# Patient Record
Sex: Female | Born: 1935 | Race: White | Hispanic: No | Smoking: Never smoker
Health system: Southern US, Community
[De-identification: ages and names within clinical notes are randomized; demographics above are authoritative.]

## PROBLEM LIST (undated history)

## (undated) DIAGNOSIS — K219 Gastro-esophageal reflux disease without esophagitis: Secondary | ICD-10-CM

## (undated) DIAGNOSIS — F329 Major depressive disorder, single episode, unspecified: Secondary | ICD-10-CM

## (undated) DIAGNOSIS — I38 Endocarditis, valve unspecified: Secondary | ICD-10-CM

## (undated) DIAGNOSIS — I10 Essential (primary) hypertension: Secondary | ICD-10-CM

## (undated) DIAGNOSIS — F419 Anxiety disorder, unspecified: Secondary | ICD-10-CM

## (undated) DIAGNOSIS — F32A Depression, unspecified: Secondary | ICD-10-CM

## (undated) DIAGNOSIS — F0390 Unspecified dementia without behavioral disturbance: Secondary | ICD-10-CM

## (undated) DIAGNOSIS — I428 Other cardiomyopathies: Secondary | ICD-10-CM

## (undated) DIAGNOSIS — I509 Heart failure, unspecified: Secondary | ICD-10-CM

## (undated) DIAGNOSIS — E039 Hypothyroidism, unspecified: Secondary | ICD-10-CM

## (undated) DIAGNOSIS — D219 Benign neoplasm of connective and other soft tissue, unspecified: Secondary | ICD-10-CM

## (undated) DIAGNOSIS — I4891 Unspecified atrial fibrillation: Secondary | ICD-10-CM

## (undated) DIAGNOSIS — C9110 Chronic lymphocytic leukemia of B-cell type not having achieved remission: Secondary | ICD-10-CM

## (undated) DIAGNOSIS — R630 Anorexia: Secondary | ICD-10-CM

## (undated) DIAGNOSIS — N2889 Other specified disorders of kidney and ureter: Secondary | ICD-10-CM

## (undated) DIAGNOSIS — R413 Other amnesia: Secondary | ICD-10-CM

## (undated) DIAGNOSIS — I872 Venous insufficiency (chronic) (peripheral): Secondary | ICD-10-CM

## (undated) DIAGNOSIS — I8390 Asymptomatic varicose veins of unspecified lower extremity: Secondary | ICD-10-CM

## (undated) DIAGNOSIS — E785 Hyperlipidemia, unspecified: Secondary | ICD-10-CM

## (undated) HISTORY — PX: OTHER SURGICAL HISTORY: SHX169

## (undated) HISTORY — DX: Venous insufficiency (chronic) (peripheral): I87.2

## (undated) HISTORY — DX: Unspecified atrial fibrillation: I48.91

## (undated) HISTORY — DX: Other specified disorders of kidney and ureter: N28.89

## (undated) HISTORY — DX: Anorexia: R63.0

## (undated) HISTORY — DX: Benign neoplasm of connective and other soft tissue, unspecified: D21.9

## (undated) HISTORY — PX: CHOLECYSTECTOMY: SHX55

## (undated) HISTORY — DX: Asymptomatic varicose veins of unspecified lower extremity: I83.90

## (undated) HISTORY — DX: Chronic lymphocytic leukemia of B-cell type not having achieved remission: C91.10

## (undated) HISTORY — DX: Unspecified dementia, unspecified severity, without behavioral disturbance, psychotic disturbance, mood disturbance, and anxiety: F03.90

## (undated) HISTORY — DX: Anxiety disorder, unspecified: F41.9

## (undated) HISTORY — DX: Depression, unspecified: F32.A

## (undated) HISTORY — DX: Hyperlipidemia, unspecified: E78.5

## (undated) HISTORY — DX: Endocarditis, valve unspecified: I38

## (undated) HISTORY — DX: Hypothyroidism, unspecified: E03.9

## (undated) HISTORY — PX: ABDOMINAL HYSTERECTOMY: SHX81

## (undated) HISTORY — DX: Major depressive disorder, single episode, unspecified: F32.9

## (undated) HISTORY — DX: Other cardiomyopathies: I42.8

## (undated) HISTORY — PX: PACEMAKER IMPLANT: EP1218

## (undated) HISTORY — PX: CARDIAC ELECTROPHYSIOLOGY STUDY AND ABLATION: SHX1294

## (undated) HISTORY — DX: Heart failure, unspecified: I50.9

## (undated) HISTORY — DX: Essential (primary) hypertension: I10

## (undated) HISTORY — DX: Gastro-esophageal reflux disease without esophagitis: K21.9

## (undated) HISTORY — DX: Other amnesia: R41.3

---
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
## 2012-03-03 DIAGNOSIS — E039 Hypothyroidism, unspecified: Secondary | ICD-10-CM | POA: Diagnosis not present

## 2012-03-03 DIAGNOSIS — E7800 Pure hypercholesterolemia, unspecified: Secondary | ICD-10-CM | POA: Diagnosis not present

## 2012-03-03 DIAGNOSIS — I10 Essential (primary) hypertension: Secondary | ICD-10-CM | POA: Diagnosis not present

## 2012-03-03 DIAGNOSIS — Z79899 Other long term (current) drug therapy: Secondary | ICD-10-CM | POA: Diagnosis not present

## 2012-03-06 DIAGNOSIS — R7309 Other abnormal glucose: Secondary | ICD-10-CM | POA: Diagnosis not present

## 2012-03-19 DIAGNOSIS — I10 Essential (primary) hypertension: Secondary | ICD-10-CM | POA: Diagnosis not present

## 2012-03-19 DIAGNOSIS — Z950 Presence of cardiac pacemaker: Secondary | ICD-10-CM | POA: Diagnosis not present

## 2012-03-19 DIAGNOSIS — E039 Hypothyroidism, unspecified: Secondary | ICD-10-CM | POA: Diagnosis not present

## 2012-03-19 DIAGNOSIS — I4891 Unspecified atrial fibrillation: Secondary | ICD-10-CM | POA: Diagnosis not present

## 2012-04-28 DIAGNOSIS — I498 Other specified cardiac arrhythmias: Secondary | ICD-10-CM | POA: Diagnosis not present

## 2012-05-07 DIAGNOSIS — H524 Presbyopia: Secondary | ICD-10-CM | POA: Diagnosis not present

## 2012-05-07 DIAGNOSIS — H35019 Changes in retinal vascular appearance, unspecified eye: Secondary | ICD-10-CM | POA: Diagnosis not present

## 2012-07-30 DIAGNOSIS — I498 Other specified cardiac arrhythmias: Secondary | ICD-10-CM | POA: Diagnosis not present

## 2012-10-05 DIAGNOSIS — I4891 Unspecified atrial fibrillation: Secondary | ICD-10-CM | POA: Diagnosis not present

## 2012-10-05 DIAGNOSIS — I10 Essential (primary) hypertension: Secondary | ICD-10-CM | POA: Diagnosis not present

## 2012-10-05 DIAGNOSIS — Z950 Presence of cardiac pacemaker: Secondary | ICD-10-CM | POA: Diagnosis not present

## 2012-10-07 DIAGNOSIS — Z23 Encounter for immunization: Secondary | ICD-10-CM | POA: Diagnosis not present

## 2012-11-03 DIAGNOSIS — I499 Cardiac arrhythmia, unspecified: Secondary | ICD-10-CM | POA: Diagnosis not present

## 2013-02-04 DIAGNOSIS — Z950 Presence of cardiac pacemaker: Secondary | ICD-10-CM | POA: Diagnosis not present

## 2013-02-04 DIAGNOSIS — I4891 Unspecified atrial fibrillation: Secondary | ICD-10-CM | POA: Diagnosis not present

## 2013-02-04 DIAGNOSIS — E039 Hypothyroidism, unspecified: Secondary | ICD-10-CM | POA: Diagnosis not present

## 2013-02-12 DIAGNOSIS — E039 Hypothyroidism, unspecified: Secondary | ICD-10-CM | POA: Diagnosis not present

## 2013-02-12 DIAGNOSIS — Z0000 Encounter for general adult medical examination without abnormal findings: Secondary | ICD-10-CM | POA: Diagnosis not present

## 2013-02-12 DIAGNOSIS — I10 Essential (primary) hypertension: Secondary | ICD-10-CM | POA: Diagnosis not present

## 2013-02-12 DIAGNOSIS — Z79899 Other long term (current) drug therapy: Secondary | ICD-10-CM | POA: Diagnosis not present

## 2013-03-31 DIAGNOSIS — Z950 Presence of cardiac pacemaker: Secondary | ICD-10-CM | POA: Diagnosis not present

## 2013-03-31 DIAGNOSIS — I4891 Unspecified atrial fibrillation: Secondary | ICD-10-CM | POA: Diagnosis not present

## 2013-03-31 DIAGNOSIS — I10 Essential (primary) hypertension: Secondary | ICD-10-CM | POA: Diagnosis not present

## 2013-04-06 DIAGNOSIS — I10 Essential (primary) hypertension: Secondary | ICD-10-CM | POA: Diagnosis not present

## 2013-04-06 DIAGNOSIS — Z79899 Other long term (current) drug therapy: Secondary | ICD-10-CM | POA: Diagnosis not present

## 2013-04-08 DIAGNOSIS — I10 Essential (primary) hypertension: Secondary | ICD-10-CM | POA: Diagnosis not present

## 2013-05-05 DIAGNOSIS — Z950 Presence of cardiac pacemaker: Secondary | ICD-10-CM | POA: Diagnosis not present

## 2013-05-05 DIAGNOSIS — I4891 Unspecified atrial fibrillation: Secondary | ICD-10-CM | POA: Diagnosis not present

## 2013-06-03 DIAGNOSIS — I4891 Unspecified atrial fibrillation: Secondary | ICD-10-CM | POA: Diagnosis not present

## 2013-08-10 DIAGNOSIS — M549 Dorsalgia, unspecified: Secondary | ICD-10-CM | POA: Diagnosis not present

## 2013-08-10 DIAGNOSIS — R52 Pain, unspecified: Secondary | ICD-10-CM | POA: Diagnosis not present

## 2013-08-10 DIAGNOSIS — I498 Other specified cardiac arrhythmias: Secondary | ICD-10-CM | POA: Diagnosis not present

## 2013-08-16 DIAGNOSIS — M545 Low back pain: Secondary | ICD-10-CM | POA: Diagnosis not present

## 2013-08-18 DIAGNOSIS — M545 Low back pain: Secondary | ICD-10-CM | POA: Diagnosis not present

## 2013-08-20 DIAGNOSIS — M545 Low back pain: Secondary | ICD-10-CM | POA: Diagnosis not present

## 2013-08-23 DIAGNOSIS — M545 Low back pain: Secondary | ICD-10-CM | POA: Diagnosis not present

## 2013-08-25 DIAGNOSIS — M545 Low back pain: Secondary | ICD-10-CM | POA: Diagnosis not present

## 2013-08-27 DIAGNOSIS — M545 Low back pain: Secondary | ICD-10-CM | POA: Diagnosis not present

## 2013-09-02 DIAGNOSIS — R5381 Other malaise: Secondary | ICD-10-CM | POA: Diagnosis not present

## 2013-09-02 DIAGNOSIS — Z79899 Other long term (current) drug therapy: Secondary | ICD-10-CM | POA: Diagnosis not present

## 2013-09-02 DIAGNOSIS — Z23 Encounter for immunization: Secondary | ICD-10-CM | POA: Diagnosis not present

## 2013-09-06 DIAGNOSIS — R7309 Other abnormal glucose: Secondary | ICD-10-CM | POA: Diagnosis not present

## 2013-09-09 DIAGNOSIS — I4891 Unspecified atrial fibrillation: Secondary | ICD-10-CM | POA: Diagnosis not present

## 2013-09-09 DIAGNOSIS — E039 Hypothyroidism, unspecified: Secondary | ICD-10-CM | POA: Diagnosis not present

## 2013-09-09 DIAGNOSIS — Z950 Presence of cardiac pacemaker: Secondary | ICD-10-CM | POA: Diagnosis not present

## 2013-09-09 DIAGNOSIS — R5381 Other malaise: Secondary | ICD-10-CM | POA: Diagnosis not present

## 2013-09-09 DIAGNOSIS — I495 Sick sinus syndrome: Secondary | ICD-10-CM | POA: Diagnosis not present

## 2013-09-09 DIAGNOSIS — I10 Essential (primary) hypertension: Secondary | ICD-10-CM | POA: Diagnosis not present

## 2013-09-13 DIAGNOSIS — Z79899 Other long term (current) drug therapy: Secondary | ICD-10-CM | POA: Diagnosis not present

## 2013-09-13 DIAGNOSIS — I4891 Unspecified atrial fibrillation: Secondary | ICD-10-CM | POA: Diagnosis not present

## 2013-09-13 DIAGNOSIS — I10 Essential (primary) hypertension: Secondary | ICD-10-CM | POA: Diagnosis not present

## 2013-09-13 DIAGNOSIS — D649 Anemia, unspecified: Secondary | ICD-10-CM | POA: Diagnosis not present

## 2013-10-01 DIAGNOSIS — M5137 Other intervertebral disc degeneration, lumbosacral region: Secondary | ICD-10-CM | POA: Diagnosis not present

## 2013-10-01 DIAGNOSIS — M545 Low back pain: Secondary | ICD-10-CM | POA: Diagnosis not present

## 2013-10-01 DIAGNOSIS — M4120 Other idiopathic scoliosis, site unspecified: Secondary | ICD-10-CM | POA: Diagnosis not present

## 2013-10-05 DIAGNOSIS — M545 Low back pain: Secondary | ICD-10-CM | POA: Diagnosis not present

## 2013-10-08 DIAGNOSIS — M545 Low back pain: Secondary | ICD-10-CM | POA: Diagnosis not present

## 2013-10-11 DIAGNOSIS — M545 Low back pain: Secondary | ICD-10-CM | POA: Diagnosis not present

## 2013-10-13 DIAGNOSIS — M545 Low back pain: Secondary | ICD-10-CM | POA: Diagnosis not present

## 2013-10-14 DIAGNOSIS — I495 Sick sinus syndrome: Secondary | ICD-10-CM | POA: Diagnosis not present

## 2013-10-14 DIAGNOSIS — I10 Essential (primary) hypertension: Secondary | ICD-10-CM | POA: Diagnosis not present

## 2013-10-14 DIAGNOSIS — R5381 Other malaise: Secondary | ICD-10-CM | POA: Diagnosis not present

## 2013-10-14 DIAGNOSIS — I4891 Unspecified atrial fibrillation: Secondary | ICD-10-CM | POA: Diagnosis not present

## 2013-10-14 DIAGNOSIS — E039 Hypothyroidism, unspecified: Secondary | ICD-10-CM | POA: Diagnosis not present

## 2013-10-14 DIAGNOSIS — Z950 Presence of cardiac pacemaker: Secondary | ICD-10-CM | POA: Diagnosis not present

## 2013-10-25 DIAGNOSIS — I4891 Unspecified atrial fibrillation: Secondary | ICD-10-CM | POA: Diagnosis not present

## 2013-11-11 DIAGNOSIS — I498 Other specified cardiac arrhythmias: Secondary | ICD-10-CM | POA: Diagnosis not present

## 2013-11-15 DIAGNOSIS — I10 Essential (primary) hypertension: Secondary | ICD-10-CM | POA: Diagnosis not present

## 2013-11-15 DIAGNOSIS — Z950 Presence of cardiac pacemaker: Secondary | ICD-10-CM | POA: Diagnosis not present

## 2013-11-15 DIAGNOSIS — R5381 Other malaise: Secondary | ICD-10-CM | POA: Diagnosis not present

## 2013-11-15 DIAGNOSIS — E039 Hypothyroidism, unspecified: Secondary | ICD-10-CM | POA: Diagnosis not present

## 2013-11-15 DIAGNOSIS — I495 Sick sinus syndrome: Secondary | ICD-10-CM | POA: Diagnosis not present

## 2013-11-15 DIAGNOSIS — I4891 Unspecified atrial fibrillation: Secondary | ICD-10-CM | POA: Diagnosis not present

## 2013-11-26 DIAGNOSIS — M549 Dorsalgia, unspecified: Secondary | ICD-10-CM | POA: Diagnosis not present

## 2013-12-01 DIAGNOSIS — E039 Hypothyroidism, unspecified: Secondary | ICD-10-CM | POA: Diagnosis not present

## 2013-12-01 DIAGNOSIS — I4891 Unspecified atrial fibrillation: Secondary | ICD-10-CM | POA: Diagnosis not present

## 2013-12-01 DIAGNOSIS — Z79899 Other long term (current) drug therapy: Secondary | ICD-10-CM | POA: Diagnosis not present

## 2013-12-01 DIAGNOSIS — D649 Anemia, unspecified: Secondary | ICD-10-CM | POA: Diagnosis not present

## 2013-12-01 DIAGNOSIS — I10 Essential (primary) hypertension: Secondary | ICD-10-CM | POA: Diagnosis not present

## 2013-12-03 ENCOUNTER — Other Ambulatory Visit: Payer: Self-pay | Admitting: Orthopaedic Surgery

## 2013-12-03 DIAGNOSIS — M545 Low back pain, unspecified: Secondary | ICD-10-CM

## 2013-12-06 ENCOUNTER — Ambulatory Visit
Admission: RE | Admit: 2013-12-06 | Discharge: 2013-12-06 | Disposition: A | Discharge status: Home / Self Care | Payer: Medicare Other | Source: Ambulatory Visit | Attending: Orthopaedic Surgery | Admitting: Orthopaedic Surgery

## 2013-12-06 DIAGNOSIS — M48061 Spinal stenosis, lumbar region without neurogenic claudication: Secondary | ICD-10-CM | POA: Diagnosis not present

## 2013-12-06 DIAGNOSIS — M545 Low back pain, unspecified: Secondary | ICD-10-CM

## 2013-12-06 DIAGNOSIS — M4120 Other idiopathic scoliosis, site unspecified: Secondary | ICD-10-CM | POA: Diagnosis not present

## 2013-12-06 DIAGNOSIS — M5137 Other intervertebral disc degeneration, lumbosacral region: Secondary | ICD-10-CM | POA: Diagnosis not present

## 2013-12-09 DIAGNOSIS — M545 Low back pain, unspecified: Secondary | ICD-10-CM | POA: Diagnosis not present

## 2013-12-09 DIAGNOSIS — M4120 Other idiopathic scoliosis, site unspecified: Secondary | ICD-10-CM | POA: Diagnosis not present

## 2013-12-09 DIAGNOSIS — M5137 Other intervertebral disc degeneration, lumbosacral region: Secondary | ICD-10-CM | POA: Diagnosis not present

## 2013-12-14 DIAGNOSIS — M4120 Other idiopathic scoliosis, site unspecified: Secondary | ICD-10-CM | POA: Diagnosis not present

## 2013-12-17 DIAGNOSIS — R112 Nausea with vomiting, unspecified: Secondary | ICD-10-CM | POA: Diagnosis not present

## 2013-12-22 DIAGNOSIS — M47817 Spondylosis without myelopathy or radiculopathy, lumbosacral region: Secondary | ICD-10-CM | POA: Diagnosis not present

## 2013-12-22 DIAGNOSIS — M545 Low back pain, unspecified: Secondary | ICD-10-CM | POA: Diagnosis not present

## 2013-12-22 DIAGNOSIS — M4120 Other idiopathic scoliosis, site unspecified: Secondary | ICD-10-CM | POA: Diagnosis not present

## 2013-12-29 DIAGNOSIS — I4891 Unspecified atrial fibrillation: Secondary | ICD-10-CM | POA: Diagnosis not present

## 2014-01-04 DIAGNOSIS — M545 Low back pain, unspecified: Secondary | ICD-10-CM | POA: Diagnosis not present

## 2014-01-04 DIAGNOSIS — M4120 Other idiopathic scoliosis, site unspecified: Secondary | ICD-10-CM | POA: Diagnosis not present

## 2014-01-04 DIAGNOSIS — M47817 Spondylosis without myelopathy or radiculopathy, lumbosacral region: Secondary | ICD-10-CM | POA: Diagnosis not present

## 2014-01-11 DIAGNOSIS — M545 Low back pain, unspecified: Secondary | ICD-10-CM | POA: Diagnosis not present

## 2014-01-11 DIAGNOSIS — M4120 Other idiopathic scoliosis, site unspecified: Secondary | ICD-10-CM | POA: Diagnosis not present

## 2014-01-11 DIAGNOSIS — M47817 Spondylosis without myelopathy or radiculopathy, lumbosacral region: Secondary | ICD-10-CM | POA: Diagnosis not present

## 2014-01-27 DIAGNOSIS — R5381 Other malaise: Secondary | ICD-10-CM | POA: Diagnosis not present

## 2014-01-27 DIAGNOSIS — Z950 Presence of cardiac pacemaker: Secondary | ICD-10-CM | POA: Diagnosis not present

## 2014-01-27 DIAGNOSIS — I495 Sick sinus syndrome: Secondary | ICD-10-CM | POA: Diagnosis not present

## 2014-01-27 DIAGNOSIS — I4891 Unspecified atrial fibrillation: Secondary | ICD-10-CM | POA: Diagnosis not present

## 2014-01-27 DIAGNOSIS — R5383 Other fatigue: Secondary | ICD-10-CM | POA: Diagnosis not present

## 2014-01-27 DIAGNOSIS — I10 Essential (primary) hypertension: Secondary | ICD-10-CM | POA: Diagnosis not present

## 2014-02-01 DIAGNOSIS — I4891 Unspecified atrial fibrillation: Secondary | ICD-10-CM | POA: Diagnosis not present

## 2014-02-01 DIAGNOSIS — Z950 Presence of cardiac pacemaker: Secondary | ICD-10-CM | POA: Diagnosis not present

## 2014-02-23 DIAGNOSIS — M48061 Spinal stenosis, lumbar region without neurogenic claudication: Secondary | ICD-10-CM | POA: Diagnosis not present

## 2014-02-23 DIAGNOSIS — M47817 Spondylosis without myelopathy or radiculopathy, lumbosacral region: Secondary | ICD-10-CM | POA: Diagnosis not present

## 2014-02-23 DIAGNOSIS — M5380 Other specified dorsopathies, site unspecified: Secondary | ICD-10-CM | POA: Diagnosis not present

## 2014-03-07 DIAGNOSIS — F329 Major depressive disorder, single episode, unspecified: Secondary | ICD-10-CM | POA: Diagnosis not present

## 2014-03-07 DIAGNOSIS — I4891 Unspecified atrial fibrillation: Secondary | ICD-10-CM | POA: Diagnosis not present

## 2014-03-07 DIAGNOSIS — G8929 Other chronic pain: Secondary | ICD-10-CM | POA: Diagnosis not present

## 2014-03-07 DIAGNOSIS — M549 Dorsalgia, unspecified: Secondary | ICD-10-CM | POA: Diagnosis not present

## 2014-03-28 DIAGNOSIS — Z0000 Encounter for general adult medical examination without abnormal findings: Secondary | ICD-10-CM | POA: Diagnosis not present

## 2014-03-28 DIAGNOSIS — F329 Major depressive disorder, single episode, unspecified: Secondary | ICD-10-CM | POA: Diagnosis not present

## 2014-03-30 DIAGNOSIS — M48061 Spinal stenosis, lumbar region without neurogenic claudication: Secondary | ICD-10-CM | POA: Diagnosis not present

## 2014-03-30 DIAGNOSIS — M545 Low back pain, unspecified: Secondary | ICD-10-CM | POA: Diagnosis not present

## 2014-03-30 DIAGNOSIS — M47817 Spondylosis without myelopathy or radiculopathy, lumbosacral region: Secondary | ICD-10-CM | POA: Diagnosis not present

## 2014-03-30 DIAGNOSIS — M5126 Other intervertebral disc displacement, lumbar region: Secondary | ICD-10-CM | POA: Diagnosis not present

## 2014-04-04 DIAGNOSIS — M5126 Other intervertebral disc displacement, lumbar region: Secondary | ICD-10-CM | POA: Diagnosis not present

## 2014-04-04 DIAGNOSIS — M545 Low back pain, unspecified: Secondary | ICD-10-CM | POA: Diagnosis not present

## 2014-04-04 DIAGNOSIS — M48061 Spinal stenosis, lumbar region without neurogenic claudication: Secondary | ICD-10-CM | POA: Diagnosis not present

## 2014-04-14 DIAGNOSIS — I959 Hypotension, unspecified: Secondary | ICD-10-CM | POA: Diagnosis not present

## 2014-04-14 DIAGNOSIS — E039 Hypothyroidism, unspecified: Secondary | ICD-10-CM | POA: Diagnosis not present

## 2014-04-14 DIAGNOSIS — R5381 Other malaise: Secondary | ICD-10-CM | POA: Diagnosis not present

## 2014-04-14 DIAGNOSIS — R634 Abnormal weight loss: Secondary | ICD-10-CM | POA: Diagnosis not present

## 2014-04-14 DIAGNOSIS — R5383 Other fatigue: Secondary | ICD-10-CM | POA: Diagnosis not present

## 2014-04-15 DIAGNOSIS — D649 Anemia, unspecified: Secondary | ICD-10-CM | POA: Diagnosis not present

## 2014-04-19 DIAGNOSIS — R5383 Other fatigue: Secondary | ICD-10-CM | POA: Diagnosis not present

## 2014-04-19 DIAGNOSIS — Z950 Presence of cardiac pacemaker: Secondary | ICD-10-CM | POA: Diagnosis not present

## 2014-04-19 DIAGNOSIS — I10 Essential (primary) hypertension: Secondary | ICD-10-CM | POA: Diagnosis not present

## 2014-04-19 DIAGNOSIS — I495 Sick sinus syndrome: Secondary | ICD-10-CM | POA: Diagnosis not present

## 2014-04-19 DIAGNOSIS — I4891 Unspecified atrial fibrillation: Secondary | ICD-10-CM | POA: Diagnosis not present

## 2014-04-19 DIAGNOSIS — R5381 Other malaise: Secondary | ICD-10-CM | POA: Diagnosis not present

## 2014-04-19 DIAGNOSIS — E039 Hypothyroidism, unspecified: Secondary | ICD-10-CM | POA: Diagnosis not present

## 2014-04-19 DIAGNOSIS — D649 Anemia, unspecified: Secondary | ICD-10-CM | POA: Diagnosis not present

## 2014-04-20 DIAGNOSIS — D3000 Benign neoplasm of unspecified kidney: Secondary | ICD-10-CM | POA: Diagnosis not present

## 2014-04-20 DIAGNOSIS — D510 Vitamin B12 deficiency anemia due to intrinsic factor deficiency: Secondary | ICD-10-CM | POA: Diagnosis not present

## 2014-04-21 DIAGNOSIS — I10 Essential (primary) hypertension: Secondary | ICD-10-CM | POA: Diagnosis not present

## 2014-04-21 DIAGNOSIS — F3289 Other specified depressive episodes: Secondary | ICD-10-CM | POA: Diagnosis present

## 2014-04-21 DIAGNOSIS — Z7901 Long term (current) use of anticoagulants: Secondary | ICD-10-CM | POA: Diagnosis not present

## 2014-04-21 DIAGNOSIS — R0602 Shortness of breath: Secondary | ICD-10-CM | POA: Diagnosis not present

## 2014-04-21 DIAGNOSIS — Z79899 Other long term (current) drug therapy: Secondary | ICD-10-CM | POA: Diagnosis not present

## 2014-04-21 DIAGNOSIS — E538 Deficiency of other specified B group vitamins: Secondary | ICD-10-CM | POA: Diagnosis present

## 2014-04-21 DIAGNOSIS — K5730 Diverticulosis of large intestine without perforation or abscess without bleeding: Secondary | ICD-10-CM | POA: Diagnosis present

## 2014-04-21 DIAGNOSIS — M1990 Unspecified osteoarthritis, unspecified site: Secondary | ICD-10-CM | POA: Diagnosis present

## 2014-04-21 DIAGNOSIS — F411 Generalized anxiety disorder: Secondary | ICD-10-CM | POA: Diagnosis present

## 2014-04-21 DIAGNOSIS — F329 Major depressive disorder, single episode, unspecified: Secondary | ICD-10-CM | POA: Diagnosis present

## 2014-04-21 DIAGNOSIS — Z950 Presence of cardiac pacemaker: Secondary | ICD-10-CM | POA: Diagnosis not present

## 2014-04-21 DIAGNOSIS — R634 Abnormal weight loss: Secondary | ICD-10-CM | POA: Diagnosis not present

## 2014-04-21 DIAGNOSIS — I4891 Unspecified atrial fibrillation: Secondary | ICD-10-CM | POA: Diagnosis present

## 2014-04-21 DIAGNOSIS — E876 Hypokalemia: Secondary | ICD-10-CM | POA: Diagnosis not present

## 2014-04-21 DIAGNOSIS — R112 Nausea with vomiting, unspecified: Secondary | ICD-10-CM | POA: Diagnosis not present

## 2014-04-21 DIAGNOSIS — K922 Gastrointestinal hemorrhage, unspecified: Secondary | ICD-10-CM | POA: Diagnosis not present

## 2014-04-21 DIAGNOSIS — K2900 Acute gastritis without bleeding: Secondary | ICD-10-CM | POA: Diagnosis not present

## 2014-04-21 DIAGNOSIS — M549 Dorsalgia, unspecified: Secondary | ICD-10-CM | POA: Diagnosis present

## 2014-04-21 DIAGNOSIS — D3000 Benign neoplasm of unspecified kidney: Secondary | ICD-10-CM | POA: Diagnosis present

## 2014-04-21 DIAGNOSIS — K648 Other hemorrhoids: Secondary | ICD-10-CM | POA: Diagnosis present

## 2014-04-21 DIAGNOSIS — D62 Acute posthemorrhagic anemia: Secondary | ICD-10-CM | POA: Diagnosis not present

## 2014-04-21 DIAGNOSIS — E039 Hypothyroidism, unspecified: Secondary | ICD-10-CM | POA: Diagnosis present

## 2014-04-21 DIAGNOSIS — D509 Iron deficiency anemia, unspecified: Secondary | ICD-10-CM | POA: Diagnosis not present

## 2014-04-21 DIAGNOSIS — K2940 Chronic atrophic gastritis without bleeding: Secondary | ICD-10-CM | POA: Diagnosis present

## 2014-04-21 DIAGNOSIS — K219 Gastro-esophageal reflux disease without esophagitis: Secondary | ICD-10-CM | POA: Diagnosis present

## 2014-04-23 HISTORY — PX: ESOPHAGOGASTRODUODENOSCOPY: SHX1529

## 2014-04-23 HISTORY — PX: COLONOSCOPY: SHX174

## 2014-04-27 DIAGNOSIS — I4891 Unspecified atrial fibrillation: Secondary | ICD-10-CM | POA: Diagnosis not present

## 2014-04-27 DIAGNOSIS — E039 Hypothyroidism, unspecified: Secondary | ICD-10-CM | POA: Diagnosis not present

## 2014-04-27 DIAGNOSIS — Z79899 Other long term (current) drug therapy: Secondary | ICD-10-CM | POA: Diagnosis not present

## 2014-04-27 DIAGNOSIS — I10 Essential (primary) hypertension: Secondary | ICD-10-CM | POA: Diagnosis not present

## 2014-05-02 DIAGNOSIS — E039 Hypothyroidism, unspecified: Secondary | ICD-10-CM | POA: Diagnosis not present

## 2014-05-02 DIAGNOSIS — I10 Essential (primary) hypertension: Secondary | ICD-10-CM | POA: Diagnosis not present

## 2014-05-02 DIAGNOSIS — I4891 Unspecified atrial fibrillation: Secondary | ICD-10-CM | POA: Diagnosis not present

## 2014-05-02 DIAGNOSIS — Z79899 Other long term (current) drug therapy: Secondary | ICD-10-CM | POA: Diagnosis not present

## 2014-05-03 DIAGNOSIS — I4891 Unspecified atrial fibrillation: Secondary | ICD-10-CM | POA: Diagnosis not present

## 2014-05-03 DIAGNOSIS — I10 Essential (primary) hypertension: Secondary | ICD-10-CM | POA: Diagnosis not present

## 2014-05-03 DIAGNOSIS — E039 Hypothyroidism, unspecified: Secondary | ICD-10-CM | POA: Diagnosis not present

## 2014-05-03 DIAGNOSIS — Z79899 Other long term (current) drug therapy: Secondary | ICD-10-CM | POA: Diagnosis not present

## 2014-05-04 DIAGNOSIS — N289 Disorder of kidney and ureter, unspecified: Secondary | ICD-10-CM | POA: Diagnosis not present

## 2014-05-05 DIAGNOSIS — D510 Vitamin B12 deficiency anemia due to intrinsic factor deficiency: Secondary | ICD-10-CM | POA: Diagnosis not present

## 2014-05-05 DIAGNOSIS — E876 Hypokalemia: Secondary | ICD-10-CM | POA: Diagnosis not present

## 2014-05-05 DIAGNOSIS — N289 Disorder of kidney and ureter, unspecified: Secondary | ICD-10-CM | POA: Diagnosis not present

## 2014-05-05 DIAGNOSIS — D509 Iron deficiency anemia, unspecified: Secondary | ICD-10-CM | POA: Diagnosis not present

## 2014-05-10 DIAGNOSIS — D649 Anemia, unspecified: Secondary | ICD-10-CM | POA: Diagnosis not present

## 2014-05-10 DIAGNOSIS — D509 Iron deficiency anemia, unspecified: Secondary | ICD-10-CM | POA: Diagnosis not present

## 2014-05-11 DIAGNOSIS — Z950 Presence of cardiac pacemaker: Secondary | ICD-10-CM | POA: Diagnosis not present

## 2014-05-11 DIAGNOSIS — I495 Sick sinus syndrome: Secondary | ICD-10-CM | POA: Diagnosis not present

## 2014-05-20 DIAGNOSIS — D510 Vitamin B12 deficiency anemia due to intrinsic factor deficiency: Secondary | ICD-10-CM | POA: Diagnosis not present

## 2014-05-30 DIAGNOSIS — M6281 Muscle weakness (generalized): Secondary | ICD-10-CM | POA: Diagnosis not present

## 2014-05-30 DIAGNOSIS — IMO0001 Reserved for inherently not codable concepts without codable children: Secondary | ICD-10-CM | POA: Diagnosis not present

## 2014-05-30 DIAGNOSIS — M546 Pain in thoracic spine: Secondary | ICD-10-CM | POA: Diagnosis not present

## 2014-06-01 DIAGNOSIS — M546 Pain in thoracic spine: Secondary | ICD-10-CM | POA: Diagnosis not present

## 2014-06-01 DIAGNOSIS — M6281 Muscle weakness (generalized): Secondary | ICD-10-CM | POA: Diagnosis not present

## 2014-06-01 DIAGNOSIS — IMO0001 Reserved for inherently not codable concepts without codable children: Secondary | ICD-10-CM | POA: Diagnosis not present

## 2014-06-06 DIAGNOSIS — I495 Sick sinus syndrome: Secondary | ICD-10-CM | POA: Diagnosis not present

## 2014-06-06 DIAGNOSIS — I10 Essential (primary) hypertension: Secondary | ICD-10-CM | POA: Diagnosis not present

## 2014-06-06 DIAGNOSIS — R5383 Other fatigue: Secondary | ICD-10-CM | POA: Diagnosis not present

## 2014-06-06 DIAGNOSIS — R5381 Other malaise: Secondary | ICD-10-CM | POA: Diagnosis not present

## 2014-06-06 DIAGNOSIS — E039 Hypothyroidism, unspecified: Secondary | ICD-10-CM | POA: Diagnosis not present

## 2014-06-06 DIAGNOSIS — I4891 Unspecified atrial fibrillation: Secondary | ICD-10-CM | POA: Diagnosis not present

## 2014-06-06 DIAGNOSIS — Z950 Presence of cardiac pacemaker: Secondary | ICD-10-CM | POA: Diagnosis not present

## 2014-06-08 DIAGNOSIS — N289 Disorder of kidney and ureter, unspecified: Secondary | ICD-10-CM | POA: Diagnosis not present

## 2014-06-15 DIAGNOSIS — M6281 Muscle weakness (generalized): Secondary | ICD-10-CM | POA: Diagnosis not present

## 2014-06-15 DIAGNOSIS — M546 Pain in thoracic spine: Secondary | ICD-10-CM | POA: Diagnosis not present

## 2014-06-15 DIAGNOSIS — IMO0001 Reserved for inherently not codable concepts without codable children: Secondary | ICD-10-CM | POA: Diagnosis not present

## 2014-06-17 DIAGNOSIS — IMO0001 Reserved for inherently not codable concepts without codable children: Secondary | ICD-10-CM | POA: Diagnosis not present

## 2014-06-17 DIAGNOSIS — M6281 Muscle weakness (generalized): Secondary | ICD-10-CM | POA: Diagnosis not present

## 2014-06-17 DIAGNOSIS — M546 Pain in thoracic spine: Secondary | ICD-10-CM | POA: Diagnosis not present

## 2014-06-20 DIAGNOSIS — IMO0001 Reserved for inherently not codable concepts without codable children: Secondary | ICD-10-CM | POA: Diagnosis not present

## 2014-06-20 DIAGNOSIS — M546 Pain in thoracic spine: Secondary | ICD-10-CM | POA: Diagnosis not present

## 2014-06-20 DIAGNOSIS — M6281 Muscle weakness (generalized): Secondary | ICD-10-CM | POA: Diagnosis not present

## 2014-06-28 DIAGNOSIS — M6281 Muscle weakness (generalized): Secondary | ICD-10-CM | POA: Diagnosis not present

## 2014-06-28 DIAGNOSIS — M546 Pain in thoracic spine: Secondary | ICD-10-CM | POA: Diagnosis not present

## 2014-06-28 DIAGNOSIS — IMO0001 Reserved for inherently not codable concepts without codable children: Secondary | ICD-10-CM | POA: Diagnosis not present

## 2014-07-07 DIAGNOSIS — IMO0001 Reserved for inherently not codable concepts without codable children: Secondary | ICD-10-CM | POA: Diagnosis not present

## 2014-07-07 DIAGNOSIS — M546 Pain in thoracic spine: Secondary | ICD-10-CM | POA: Diagnosis not present

## 2014-07-07 DIAGNOSIS — M6281 Muscle weakness (generalized): Secondary | ICD-10-CM | POA: Diagnosis not present

## 2014-07-28 DIAGNOSIS — I10 Essential (primary) hypertension: Secondary | ICD-10-CM | POA: Diagnosis not present

## 2014-07-28 DIAGNOSIS — D509 Iron deficiency anemia, unspecified: Secondary | ICD-10-CM | POA: Diagnosis not present

## 2014-07-28 DIAGNOSIS — R5383 Other fatigue: Secondary | ICD-10-CM | POA: Diagnosis not present

## 2014-07-28 DIAGNOSIS — D510 Vitamin B12 deficiency anemia due to intrinsic factor deficiency: Secondary | ICD-10-CM | POA: Diagnosis not present

## 2014-07-28 DIAGNOSIS — E039 Hypothyroidism, unspecified: Secondary | ICD-10-CM | POA: Diagnosis not present

## 2014-07-28 DIAGNOSIS — Z23 Encounter for immunization: Secondary | ICD-10-CM | POA: Diagnosis not present

## 2014-07-28 DIAGNOSIS — R5381 Other malaise: Secondary | ICD-10-CM | POA: Diagnosis not present

## 2014-08-12 DIAGNOSIS — Z79899 Other long term (current) drug therapy: Secondary | ICD-10-CM | POA: Diagnosis not present

## 2014-08-12 DIAGNOSIS — F411 Generalized anxiety disorder: Secondary | ICD-10-CM | POA: Diagnosis present

## 2014-08-12 DIAGNOSIS — I10 Essential (primary) hypertension: Secondary | ICD-10-CM | POA: Diagnosis present

## 2014-08-12 DIAGNOSIS — R079 Chest pain, unspecified: Secondary | ICD-10-CM | POA: Diagnosis not present

## 2014-08-12 DIAGNOSIS — I501 Left ventricular failure: Secondary | ICD-10-CM | POA: Diagnosis not present

## 2014-08-12 DIAGNOSIS — R0902 Hypoxemia: Secondary | ICD-10-CM | POA: Diagnosis not present

## 2014-08-12 DIAGNOSIS — D649 Anemia, unspecified: Secondary | ICD-10-CM | POA: Diagnosis not present

## 2014-08-12 DIAGNOSIS — F3289 Other specified depressive episodes: Secondary | ICD-10-CM | POA: Diagnosis present

## 2014-08-12 DIAGNOSIS — K219 Gastro-esophageal reflux disease without esophagitis: Secondary | ICD-10-CM | POA: Diagnosis present

## 2014-08-12 DIAGNOSIS — Z950 Presence of cardiac pacemaker: Secondary | ICD-10-CM | POA: Diagnosis not present

## 2014-08-12 DIAGNOSIS — J90 Pleural effusion, not elsewhere classified: Secondary | ICD-10-CM | POA: Diagnosis not present

## 2014-08-12 DIAGNOSIS — I369 Nonrheumatic tricuspid valve disorder, unspecified: Secondary | ICD-10-CM | POA: Diagnosis not present

## 2014-08-12 DIAGNOSIS — J189 Pneumonia, unspecified organism: Secondary | ICD-10-CM | POA: Diagnosis not present

## 2014-08-12 DIAGNOSIS — I200 Unstable angina: Secondary | ICD-10-CM | POA: Diagnosis present

## 2014-08-12 DIAGNOSIS — I509 Heart failure, unspecified: Secondary | ICD-10-CM | POA: Diagnosis not present

## 2014-08-12 DIAGNOSIS — Z7901 Long term (current) use of anticoagulants: Secondary | ICD-10-CM | POA: Diagnosis not present

## 2014-08-12 DIAGNOSIS — F329 Major depressive disorder, single episode, unspecified: Secondary | ICD-10-CM | POA: Diagnosis present

## 2014-08-12 DIAGNOSIS — I4891 Unspecified atrial fibrillation: Secondary | ICD-10-CM | POA: Diagnosis present

## 2014-08-12 DIAGNOSIS — I359 Nonrheumatic aortic valve disorder, unspecified: Secondary | ICD-10-CM | POA: Diagnosis not present

## 2014-08-12 DIAGNOSIS — E039 Hypothyroidism, unspecified: Secondary | ICD-10-CM | POA: Diagnosis present

## 2014-08-12 DIAGNOSIS — M1990 Unspecified osteoarthritis, unspecified site: Secondary | ICD-10-CM | POA: Diagnosis present

## 2014-08-15 DIAGNOSIS — I200 Unstable angina: Secondary | ICD-10-CM | POA: Diagnosis not present

## 2014-08-15 DIAGNOSIS — I501 Left ventricular failure: Secondary | ICD-10-CM | POA: Diagnosis not present

## 2014-08-15 DIAGNOSIS — I4891 Unspecified atrial fibrillation: Secondary | ICD-10-CM | POA: Diagnosis not present

## 2014-08-15 DIAGNOSIS — I10 Essential (primary) hypertension: Secondary | ICD-10-CM | POA: Diagnosis not present

## 2014-08-15 DIAGNOSIS — Z950 Presence of cardiac pacemaker: Secondary | ICD-10-CM | POA: Diagnosis not present

## 2014-08-16 DIAGNOSIS — Z950 Presence of cardiac pacemaker: Secondary | ICD-10-CM | POA: Diagnosis not present

## 2014-08-16 DIAGNOSIS — I501 Left ventricular failure: Secondary | ICD-10-CM | POA: Diagnosis not present

## 2014-08-16 DIAGNOSIS — I4891 Unspecified atrial fibrillation: Secondary | ICD-10-CM | POA: Diagnosis not present

## 2014-08-16 DIAGNOSIS — I200 Unstable angina: Secondary | ICD-10-CM | POA: Diagnosis not present

## 2014-08-16 DIAGNOSIS — I10 Essential (primary) hypertension: Secondary | ICD-10-CM | POA: Diagnosis not present

## 2014-08-18 DIAGNOSIS — I428 Other cardiomyopathies: Secondary | ICD-10-CM | POA: Diagnosis not present

## 2014-08-18 DIAGNOSIS — I509 Heart failure, unspecified: Secondary | ICD-10-CM | POA: Diagnosis not present

## 2014-08-18 DIAGNOSIS — D509 Iron deficiency anemia, unspecified: Secondary | ICD-10-CM | POA: Diagnosis not present

## 2014-08-20 DIAGNOSIS — I5020 Unspecified systolic (congestive) heart failure: Secondary | ICD-10-CM | POA: Diagnosis not present

## 2014-08-20 DIAGNOSIS — D649 Anemia, unspecified: Secondary | ICD-10-CM | POA: Diagnosis not present

## 2014-08-23 DIAGNOSIS — I5020 Unspecified systolic (congestive) heart failure: Secondary | ICD-10-CM | POA: Diagnosis not present

## 2014-08-23 DIAGNOSIS — D649 Anemia, unspecified: Secondary | ICD-10-CM | POA: Diagnosis not present

## 2014-08-25 DIAGNOSIS — D649 Anemia, unspecified: Secondary | ICD-10-CM | POA: Diagnosis not present

## 2014-08-25 DIAGNOSIS — I5020 Unspecified systolic (congestive) heart failure: Secondary | ICD-10-CM | POA: Diagnosis not present

## 2014-08-30 DIAGNOSIS — D649 Anemia, unspecified: Secondary | ICD-10-CM | POA: Diagnosis not present

## 2014-08-30 DIAGNOSIS — I5020 Unspecified systolic (congestive) heart failure: Secondary | ICD-10-CM | POA: Diagnosis not present

## 2014-09-01 DIAGNOSIS — I509 Heart failure, unspecified: Secondary | ICD-10-CM | POA: Diagnosis not present

## 2014-09-01 DIAGNOSIS — D510 Vitamin B12 deficiency anemia due to intrinsic factor deficiency: Secondary | ICD-10-CM | POA: Diagnosis not present

## 2014-09-01 DIAGNOSIS — I429 Cardiomyopathy, unspecified: Secondary | ICD-10-CM | POA: Diagnosis not present

## 2014-09-01 DIAGNOSIS — R0689 Other abnormalities of breathing: Secondary | ICD-10-CM | POA: Diagnosis not present

## 2014-09-02 DIAGNOSIS — Z950 Presence of cardiac pacemaker: Secondary | ICD-10-CM | POA: Diagnosis not present

## 2014-09-02 DIAGNOSIS — I495 Sick sinus syndrome: Secondary | ICD-10-CM | POA: Diagnosis not present

## 2014-09-06 DIAGNOSIS — D649 Anemia, unspecified: Secondary | ICD-10-CM | POA: Diagnosis not present

## 2014-09-06 DIAGNOSIS — I5020 Unspecified systolic (congestive) heart failure: Secondary | ICD-10-CM | POA: Diagnosis not present

## 2014-09-08 DIAGNOSIS — I5020 Unspecified systolic (congestive) heart failure: Secondary | ICD-10-CM | POA: Diagnosis not present

## 2014-09-08 DIAGNOSIS — D649 Anemia, unspecified: Secondary | ICD-10-CM | POA: Diagnosis not present

## 2014-09-15 DIAGNOSIS — D649 Anemia, unspecified: Secondary | ICD-10-CM | POA: Diagnosis not present

## 2014-09-15 DIAGNOSIS — I5020 Unspecified systolic (congestive) heart failure: Secondary | ICD-10-CM | POA: Diagnosis not present

## 2014-09-22 DIAGNOSIS — I10 Essential (primary) hypertension: Secondary | ICD-10-CM | POA: Diagnosis not present

## 2014-09-22 DIAGNOSIS — I4891 Unspecified atrial fibrillation: Secondary | ICD-10-CM | POA: Diagnosis not present

## 2014-09-22 DIAGNOSIS — I5020 Unspecified systolic (congestive) heart failure: Secondary | ICD-10-CM | POA: Diagnosis not present

## 2014-09-22 DIAGNOSIS — E039 Hypothyroidism, unspecified: Secondary | ICD-10-CM | POA: Diagnosis not present

## 2014-09-22 DIAGNOSIS — I495 Sick sinus syndrome: Secondary | ICD-10-CM | POA: Diagnosis not present

## 2014-09-22 DIAGNOSIS — D649 Anemia, unspecified: Secondary | ICD-10-CM | POA: Diagnosis not present

## 2014-09-22 DIAGNOSIS — Z950 Presence of cardiac pacemaker: Secondary | ICD-10-CM | POA: Diagnosis not present

## 2014-09-28 DIAGNOSIS — I5020 Unspecified systolic (congestive) heart failure: Secondary | ICD-10-CM | POA: Diagnosis not present

## 2014-09-28 DIAGNOSIS — D649 Anemia, unspecified: Secondary | ICD-10-CM | POA: Diagnosis not present

## 2014-10-06 DIAGNOSIS — I5020 Unspecified systolic (congestive) heart failure: Secondary | ICD-10-CM | POA: Diagnosis not present

## 2014-10-06 DIAGNOSIS — D649 Anemia, unspecified: Secondary | ICD-10-CM | POA: Diagnosis not present

## 2014-10-11 DIAGNOSIS — I5020 Unspecified systolic (congestive) heart failure: Secondary | ICD-10-CM | POA: Diagnosis not present

## 2014-10-11 DIAGNOSIS — D649 Anemia, unspecified: Secondary | ICD-10-CM | POA: Diagnosis not present

## 2014-10-18 DIAGNOSIS — D509 Iron deficiency anemia, unspecified: Secondary | ICD-10-CM | POA: Diagnosis not present

## 2014-10-24 DIAGNOSIS — D509 Iron deficiency anemia, unspecified: Secondary | ICD-10-CM | POA: Diagnosis not present

## 2014-11-15 DIAGNOSIS — M5126 Other intervertebral disc displacement, lumbar region: Secondary | ICD-10-CM | POA: Diagnosis not present

## 2014-12-06 DIAGNOSIS — M5126 Other intervertebral disc displacement, lumbar region: Secondary | ICD-10-CM | POA: Diagnosis not present

## 2014-12-06 DIAGNOSIS — I499 Cardiac arrhythmia, unspecified: Secondary | ICD-10-CM | POA: Diagnosis not present

## 2015-01-10 DIAGNOSIS — M5126 Other intervertebral disc displacement, lumbar region: Secondary | ICD-10-CM | POA: Diagnosis not present

## 2015-01-11 DIAGNOSIS — I480 Paroxysmal atrial fibrillation: Secondary | ICD-10-CM | POA: Diagnosis not present

## 2015-01-11 DIAGNOSIS — I495 Sick sinus syndrome: Secondary | ICD-10-CM | POA: Diagnosis not present

## 2015-01-11 DIAGNOSIS — I10 Essential (primary) hypertension: Secondary | ICD-10-CM | POA: Diagnosis not present

## 2015-03-09 DIAGNOSIS — I498 Other specified cardiac arrhythmias: Secondary | ICD-10-CM | POA: Diagnosis not present

## 2015-04-06 DIAGNOSIS — I440 Atrioventricular block, first degree: Secondary | ICD-10-CM | POA: Diagnosis not present

## 2015-04-06 DIAGNOSIS — Z45010 Encounter for checking and testing of cardiac pacemaker pulse generator [battery]: Secondary | ICD-10-CM | POA: Diagnosis not present

## 2015-04-13 DIAGNOSIS — Z950 Presence of cardiac pacemaker: Secondary | ICD-10-CM | POA: Diagnosis not present

## 2015-04-13 DIAGNOSIS — I4891 Unspecified atrial fibrillation: Secondary | ICD-10-CM | POA: Diagnosis not present

## 2015-04-21 DIAGNOSIS — D509 Iron deficiency anemia, unspecified: Secondary | ICD-10-CM | POA: Diagnosis not present

## 2015-04-24 DIAGNOSIS — D509 Iron deficiency anemia, unspecified: Secondary | ICD-10-CM | POA: Diagnosis not present

## 2015-07-11 DIAGNOSIS — I498 Other specified cardiac arrhythmias: Secondary | ICD-10-CM | POA: Diagnosis not present

## 2015-07-11 DIAGNOSIS — Z45010 Encounter for checking and testing of cardiac pacemaker pulse generator [battery]: Secondary | ICD-10-CM | POA: Diagnosis not present

## 2015-07-23 IMAGING — CT CT L SPINE W/O CM
4 of 10 series · 12 of 33 positions shown, 14 images · non-contrast
Comparison: None.

CLINICAL DATA: Low back pain for several months. No known injury.
No leg pain.

EXAM:
CT LUMBAR SPINE WITHOUT CONTRAST
TECHNIQUE: Multidetector CT imaging of the lumbar spine was performed without
intravenous contrast administration. Multiplanar CT image
reconstructions were also generated.

[Series 4: l spine bone · axial · 0.27mm/px · z∈[-4,+66]mm · 2 of 86 slices shown, 3 images]
[im 29/86  soft-tissue]
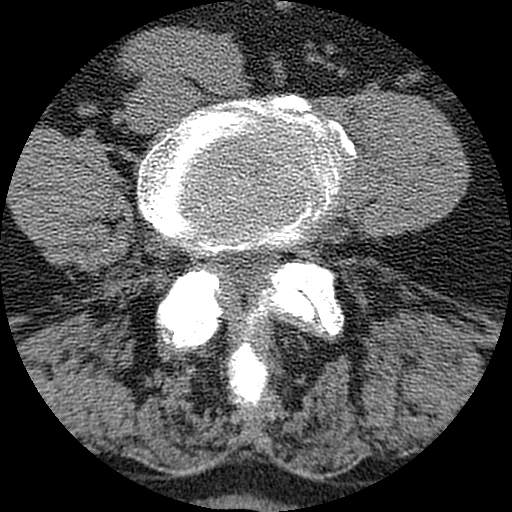
[im 29/86  bone]
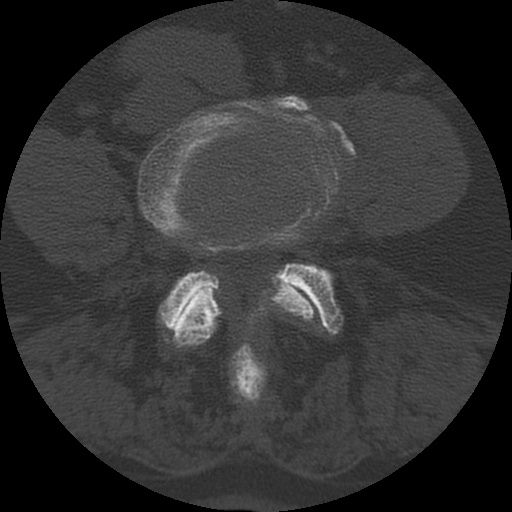
[im 57/86  bone]
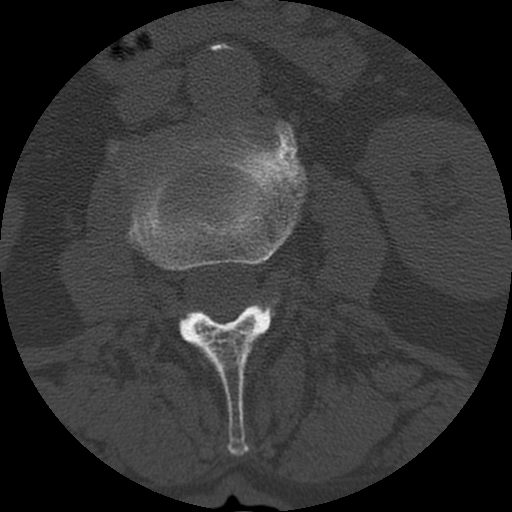

[Series 5: l spine detail · axial · 0.27mm/px · z∈[-4,+66]mm · 2 of 86 slices shown]
[im 29/86  bone]
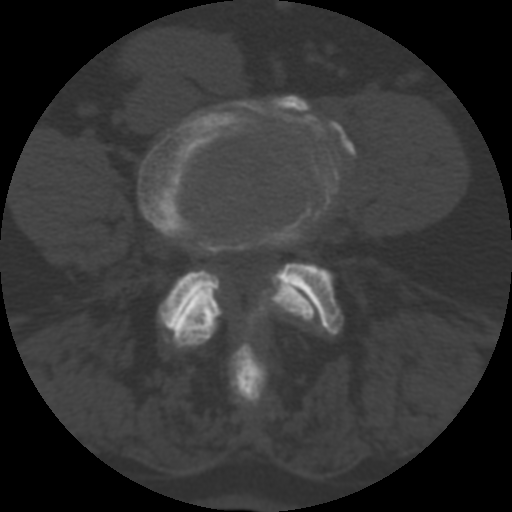
[im 57/86  bone]
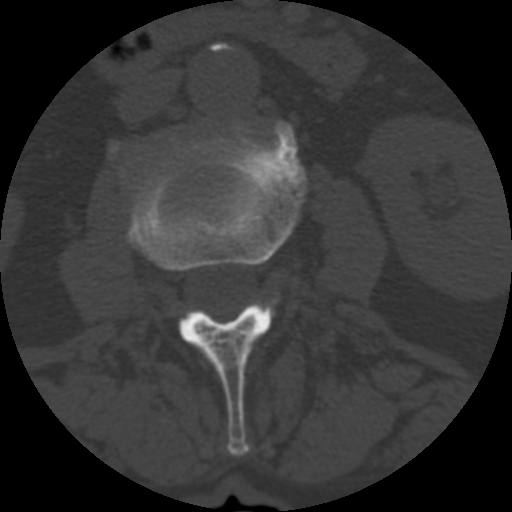

[Series 200: coronal · coronal · 0.43mm/px · 3 of 50 slices shown]
[im 10/50  bone]
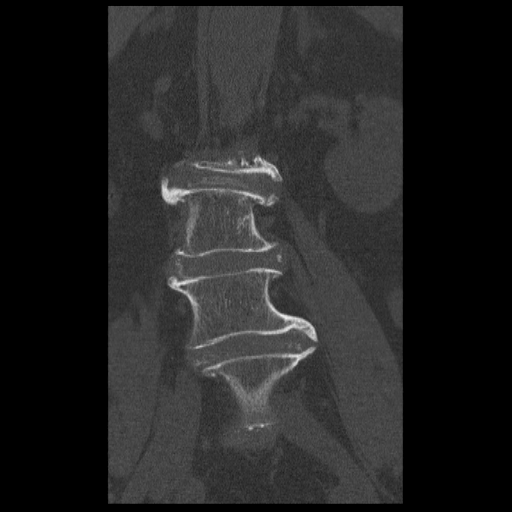
[im 20/50  bone]
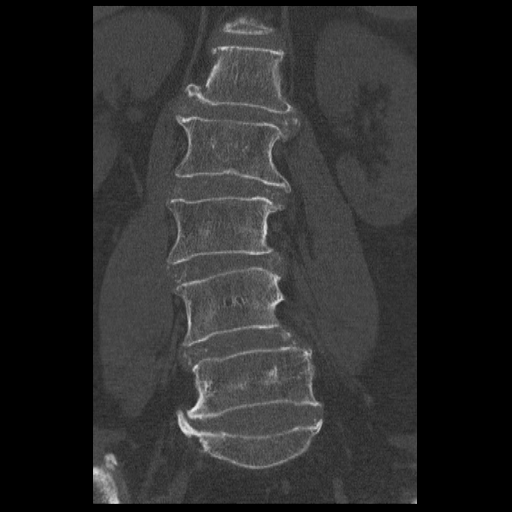
[im 30/50  bone]
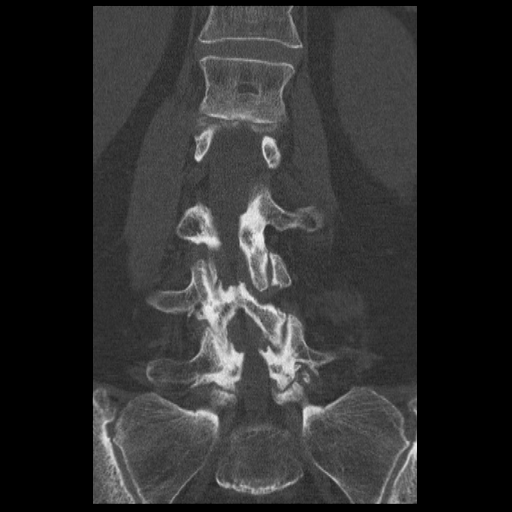

[Series 201: sagittal · sagittal · 0.43mm/px · 5 of 50 slices shown, 6 images]
[im 17/50  bone]
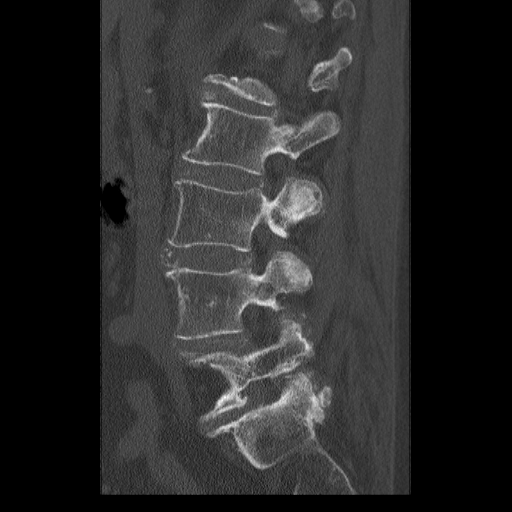
[im 21/50  bone]
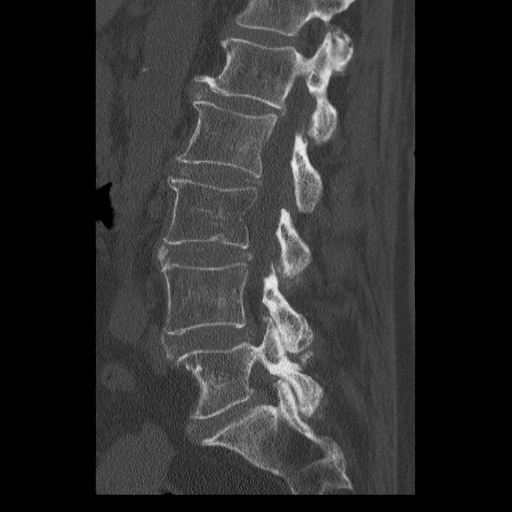
[im 25/50  soft-tissue]
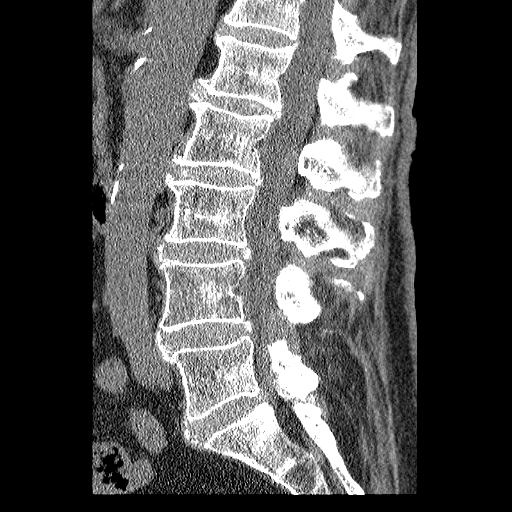
[im 25/50  bone]
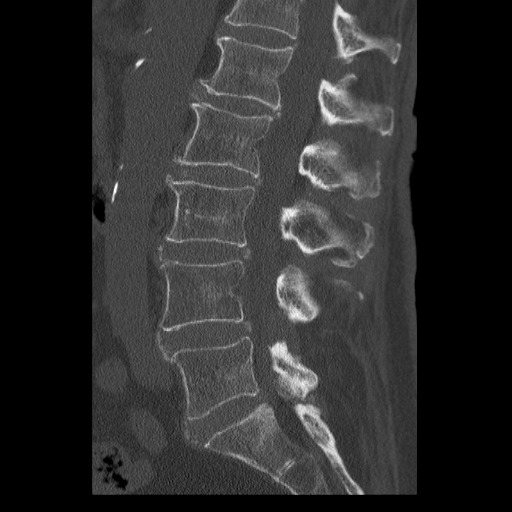
[im 29/50  bone]
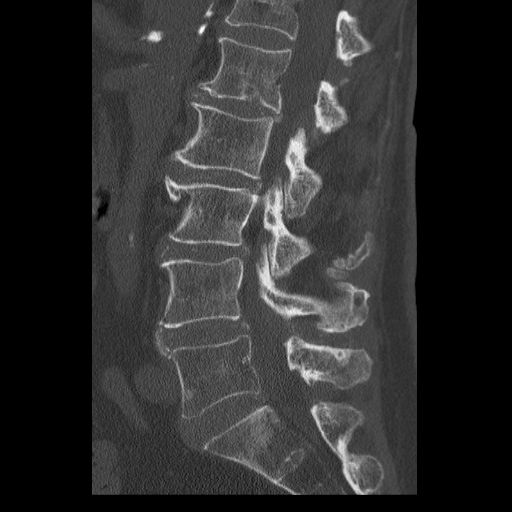
[im 33/50  bone]
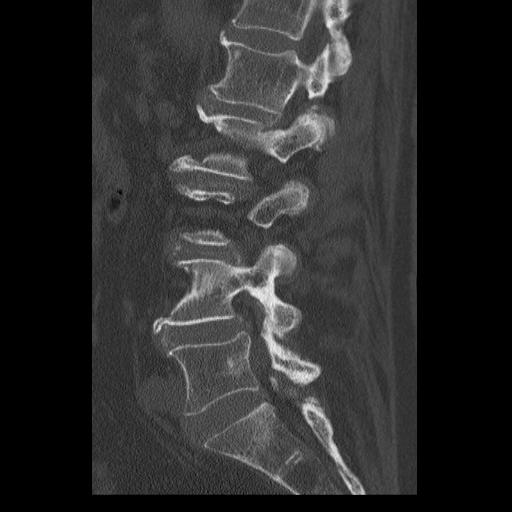

[12 of 33 positions shown; findings below may reference images not displayed]

FINDINGS: Mild to moderate lumbar dextroscoliosis is present. There is trace
anterolisthesis of L4 on L5, likely due to facet disease. There is
no evidence of acute fracture. Vertebral body heights are
maintained. A 1 cm lesion is present in the medial upper pole of the
left kidney and may represent a small cyst or possibly
angiomyolipoma given question of small amount of fat in the lesion.
Incidental note is made of a circumaortic left renal vein.

T12-L1: Minimal disc bulge without evidence of spinal canal or
neural foraminal stenosis.

L1-2: Mild disc bulge without evidence of spinal canal or neural
foraminal stenosis.

L2-3: Mild disc bulge without evidence of spinal canal or neural
foraminal stenosis.

L3-4: Mild disc bulge and ligamentum flavum hypertrophy results in
likely mild spinal canal narrowing. There is mild facet arthrosis.
Minimal left neural foraminal narrowing.

L4-5: Mild disc bulge and ligamentum flavum hypertrophy result in
likely moderate spinal stenosis. There is mild facet arthrosis and
minimal left neural foraminal narrowing.

L5-S1: Right foraminal disc protrusion and moderate facet arthrosis
results in moderate right neural foraminal stenosis. No evidence of
spinal canal stenosis.
IMPRESSION: 1. Mild to moderate lumbar dextroscoliosis with mild to moderate
multilevel degenerative disc disease and facet arthrosis. There is
moderate spinal stenosis at L4-5, and there is moderate right neural
foraminal stenosis at L5-S1.
2. 1 cm left renal lesion, incompletely evaluated although its
attenuation is suggestive of a cyst or possibly angiomyolipoma.

## 2015-07-24 DIAGNOSIS — D509 Iron deficiency anemia, unspecified: Secondary | ICD-10-CM | POA: Diagnosis not present

## 2015-07-25 DIAGNOSIS — I4821 Permanent atrial fibrillation: Secondary | ICD-10-CM | POA: Insufficient documentation

## 2015-07-25 DIAGNOSIS — Z950 Presence of cardiac pacemaker: Secondary | ICD-10-CM | POA: Insufficient documentation

## 2015-07-25 DIAGNOSIS — D518 Other vitamin B12 deficiency anemias: Secondary | ICD-10-CM | POA: Diagnosis not present

## 2015-07-25 DIAGNOSIS — D509 Iron deficiency anemia, unspecified: Secondary | ICD-10-CM | POA: Diagnosis not present

## 2015-07-26 DIAGNOSIS — I10 Essential (primary) hypertension: Secondary | ICD-10-CM | POA: Insufficient documentation

## 2015-07-26 DIAGNOSIS — Z950 Presence of cardiac pacemaker: Secondary | ICD-10-CM | POA: Diagnosis not present

## 2015-07-26 DIAGNOSIS — Z9889 Other specified postprocedural states: Secondary | ICD-10-CM | POA: Diagnosis not present

## 2015-07-26 DIAGNOSIS — I482 Chronic atrial fibrillation: Secondary | ICD-10-CM | POA: Diagnosis not present

## 2015-07-28 DIAGNOSIS — Z0000 Encounter for general adult medical examination without abnormal findings: Secondary | ICD-10-CM | POA: Diagnosis not present

## 2015-07-28 DIAGNOSIS — I509 Heart failure, unspecified: Secondary | ICD-10-CM | POA: Diagnosis not present

## 2015-07-28 DIAGNOSIS — E039 Hypothyroidism, unspecified: Secondary | ICD-10-CM | POA: Diagnosis not present

## 2015-07-28 DIAGNOSIS — Z23 Encounter for immunization: Secondary | ICD-10-CM | POA: Diagnosis not present

## 2015-07-28 DIAGNOSIS — I10 Essential (primary) hypertension: Secondary | ICD-10-CM | POA: Diagnosis not present

## 2015-07-28 DIAGNOSIS — Z79899 Other long term (current) drug therapy: Secondary | ICD-10-CM | POA: Diagnosis not present

## 2015-07-28 DIAGNOSIS — I429 Cardiomyopathy, unspecified: Secondary | ICD-10-CM | POA: Diagnosis not present

## 2015-08-10 DIAGNOSIS — L03113 Cellulitis of right upper limb: Secondary | ICD-10-CM | POA: Diagnosis not present

## 2015-09-25 DIAGNOSIS — D539 Nutritional anemia, unspecified: Secondary | ICD-10-CM | POA: Diagnosis not present

## 2015-09-25 DIAGNOSIS — D500 Iron deficiency anemia secondary to blood loss (chronic): Secondary | ICD-10-CM | POA: Diagnosis not present

## 2015-09-25 DIAGNOSIS — R195 Other fecal abnormalities: Secondary | ICD-10-CM | POA: Diagnosis not present

## 2015-09-25 DIAGNOSIS — R5383 Other fatigue: Secondary | ICD-10-CM | POA: Diagnosis not present

## 2015-09-25 DIAGNOSIS — R630 Anorexia: Secondary | ICD-10-CM | POA: Diagnosis not present

## 2015-09-28 DIAGNOSIS — D539 Nutritional anemia, unspecified: Secondary | ICD-10-CM | POA: Diagnosis not present

## 2015-09-28 DIAGNOSIS — N3000 Acute cystitis without hematuria: Secondary | ICD-10-CM | POA: Diagnosis not present

## 2015-09-28 DIAGNOSIS — R413 Other amnesia: Secondary | ICD-10-CM | POA: Diagnosis not present

## 2015-09-28 DIAGNOSIS — R630 Anorexia: Secondary | ICD-10-CM | POA: Diagnosis not present

## 2015-09-28 DIAGNOSIS — E876 Hypokalemia: Secondary | ICD-10-CM | POA: Diagnosis not present

## 2015-10-03 DIAGNOSIS — N2889 Other specified disorders of kidney and ureter: Secondary | ICD-10-CM | POA: Diagnosis not present

## 2015-10-03 DIAGNOSIS — R109 Unspecified abdominal pain: Secondary | ICD-10-CM | POA: Diagnosis not present

## 2015-10-05 DIAGNOSIS — Z45010 Encounter for checking and testing of cardiac pacemaker pulse generator [battery]: Secondary | ICD-10-CM | POA: Diagnosis not present

## 2015-10-05 DIAGNOSIS — I498 Other specified cardiac arrhythmias: Secondary | ICD-10-CM | POA: Diagnosis not present

## 2015-10-24 DIAGNOSIS — D509 Iron deficiency anemia, unspecified: Secondary | ICD-10-CM | POA: Diagnosis not present

## 2015-10-24 DIAGNOSIS — K219 Gastro-esophageal reflux disease without esophagitis: Secondary | ICD-10-CM | POA: Diagnosis not present

## 2015-10-24 DIAGNOSIS — R634 Abnormal weight loss: Secondary | ICD-10-CM | POA: Diagnosis not present

## 2015-12-26 DIAGNOSIS — M791 Myalgia: Secondary | ICD-10-CM | POA: Diagnosis not present

## 2015-12-26 DIAGNOSIS — M5126 Other intervertebral disc displacement, lumbar region: Secondary | ICD-10-CM | POA: Diagnosis not present

## 2015-12-26 DIAGNOSIS — M4806 Spinal stenosis, lumbar region: Secondary | ICD-10-CM | POA: Diagnosis not present

## 2015-12-26 DIAGNOSIS — M47817 Spondylosis without myelopathy or radiculopathy, lumbosacral region: Secondary | ICD-10-CM | POA: Diagnosis not present

## 2016-01-11 DIAGNOSIS — Z45018 Encounter for adjustment and management of other part of cardiac pacemaker: Secondary | ICD-10-CM | POA: Diagnosis not present

## 2016-01-11 DIAGNOSIS — I498 Other specified cardiac arrhythmias: Secondary | ICD-10-CM | POA: Diagnosis not present

## 2016-01-22 DIAGNOSIS — E039 Hypothyroidism, unspecified: Secondary | ICD-10-CM | POA: Insufficient documentation

## 2016-01-22 DIAGNOSIS — I10 Essential (primary) hypertension: Secondary | ICD-10-CM | POA: Diagnosis not present

## 2016-01-22 DIAGNOSIS — Z9889 Other specified postprocedural states: Secondary | ICD-10-CM | POA: Diagnosis not present

## 2016-01-22 DIAGNOSIS — Z45018 Encounter for adjustment and management of other part of cardiac pacemaker: Secondary | ICD-10-CM | POA: Diagnosis not present

## 2016-01-22 DIAGNOSIS — Z950 Presence of cardiac pacemaker: Secondary | ICD-10-CM | POA: Diagnosis not present

## 2016-01-22 DIAGNOSIS — I482 Chronic atrial fibrillation: Secondary | ICD-10-CM | POA: Diagnosis not present

## 2016-01-23 DIAGNOSIS — D509 Iron deficiency anemia, unspecified: Secondary | ICD-10-CM | POA: Diagnosis not present

## 2016-01-25 DIAGNOSIS — D509 Iron deficiency anemia, unspecified: Secondary | ICD-10-CM | POA: Diagnosis not present

## 2016-01-29 DIAGNOSIS — M4806 Spinal stenosis, lumbar region: Secondary | ICD-10-CM | POA: Diagnosis not present

## 2016-01-29 DIAGNOSIS — M791 Myalgia: Secondary | ICD-10-CM | POA: Diagnosis not present

## 2016-01-29 DIAGNOSIS — M5126 Other intervertebral disc displacement, lumbar region: Secondary | ICD-10-CM | POA: Diagnosis not present

## 2016-01-29 DIAGNOSIS — M47817 Spondylosis without myelopathy or radiculopathy, lumbosacral region: Secondary | ICD-10-CM | POA: Diagnosis not present

## 2016-02-27 DIAGNOSIS — D509 Iron deficiency anemia, unspecified: Secondary | ICD-10-CM | POA: Diagnosis not present

## 2016-02-27 DIAGNOSIS — N2889 Other specified disorders of kidney and ureter: Secondary | ICD-10-CM | POA: Diagnosis not present

## 2016-02-29 DIAGNOSIS — M47817 Spondylosis without myelopathy or radiculopathy, lumbosacral region: Secondary | ICD-10-CM | POA: Diagnosis not present

## 2016-02-29 DIAGNOSIS — M4806 Spinal stenosis, lumbar region: Secondary | ICD-10-CM | POA: Diagnosis not present

## 2016-02-29 DIAGNOSIS — M791 Myalgia: Secondary | ICD-10-CM | POA: Diagnosis not present

## 2016-02-29 DIAGNOSIS — M5126 Other intervertebral disc displacement, lumbar region: Secondary | ICD-10-CM | POA: Diagnosis not present

## 2016-03-01 DIAGNOSIS — N2889 Other specified disorders of kidney and ureter: Secondary | ICD-10-CM | POA: Diagnosis not present

## 2016-04-10 DIAGNOSIS — I872 Venous insufficiency (chronic) (peripheral): Secondary | ICD-10-CM | POA: Diagnosis not present

## 2016-04-10 DIAGNOSIS — E039 Hypothyroidism, unspecified: Secondary | ICD-10-CM | POA: Diagnosis not present

## 2016-04-10 DIAGNOSIS — I4891 Unspecified atrial fibrillation: Secondary | ICD-10-CM | POA: Diagnosis not present

## 2016-04-10 DIAGNOSIS — R739 Hyperglycemia, unspecified: Secondary | ICD-10-CM | POA: Diagnosis not present

## 2016-04-10 DIAGNOSIS — I509 Heart failure, unspecified: Secondary | ICD-10-CM | POA: Diagnosis not present

## 2016-04-10 DIAGNOSIS — I429 Cardiomyopathy, unspecified: Secondary | ICD-10-CM | POA: Diagnosis not present

## 2016-04-11 DIAGNOSIS — Z950 Presence of cardiac pacemaker: Secondary | ICD-10-CM | POA: Diagnosis not present

## 2016-06-18 DIAGNOSIS — M546 Pain in thoracic spine: Secondary | ICD-10-CM | POA: Diagnosis not present

## 2016-06-18 DIAGNOSIS — M791 Myalgia: Secondary | ICD-10-CM | POA: Diagnosis not present

## 2016-06-18 DIAGNOSIS — I10 Essential (primary) hypertension: Secondary | ICD-10-CM | POA: Diagnosis not present

## 2016-06-18 DIAGNOSIS — M545 Low back pain: Secondary | ICD-10-CM | POA: Diagnosis not present

## 2016-07-01 DIAGNOSIS — M5126 Other intervertebral disc displacement, lumbar region: Secondary | ICD-10-CM | POA: Diagnosis not present

## 2016-07-12 DIAGNOSIS — Z950 Presence of cardiac pacemaker: Secondary | ICD-10-CM | POA: Diagnosis not present

## 2016-07-24 DIAGNOSIS — M4120 Other idiopathic scoliosis, site unspecified: Secondary | ICD-10-CM | POA: Diagnosis not present

## 2016-07-24 DIAGNOSIS — M5126 Other intervertebral disc displacement, lumbar region: Secondary | ICD-10-CM | POA: Diagnosis not present

## 2016-07-24 DIAGNOSIS — M549 Dorsalgia, unspecified: Secondary | ICD-10-CM | POA: Diagnosis not present

## 2016-07-24 DIAGNOSIS — M47817 Spondylosis without myelopathy or radiculopathy, lumbosacral region: Secondary | ICD-10-CM | POA: Diagnosis not present

## 2016-07-24 DIAGNOSIS — M4806 Spinal stenosis, lumbar region: Secondary | ICD-10-CM | POA: Diagnosis not present

## 2016-07-30 DIAGNOSIS — Z862 Personal history of diseases of the blood and blood-forming organs and certain disorders involving the immune mechanism: Secondary | ICD-10-CM | POA: Diagnosis not present

## 2016-07-30 DIAGNOSIS — D509 Iron deficiency anemia, unspecified: Secondary | ICD-10-CM | POA: Diagnosis not present

## 2016-08-05 DIAGNOSIS — M546 Pain in thoracic spine: Secondary | ICD-10-CM | POA: Diagnosis not present

## 2016-08-05 DIAGNOSIS — M47814 Spondylosis without myelopathy or radiculopathy, thoracic region: Secondary | ICD-10-CM | POA: Diagnosis not present

## 2016-08-09 DIAGNOSIS — M5126 Other intervertebral disc displacement, lumbar region: Secondary | ICD-10-CM | POA: Diagnosis not present

## 2016-08-09 DIAGNOSIS — M546 Pain in thoracic spine: Secondary | ICD-10-CM | POA: Diagnosis not present

## 2016-08-09 DIAGNOSIS — M47814 Spondylosis without myelopathy or radiculopathy, thoracic region: Secondary | ICD-10-CM | POA: Diagnosis not present

## 2016-08-12 DIAGNOSIS — E039 Hypothyroidism, unspecified: Secondary | ICD-10-CM | POA: Diagnosis not present

## 2016-08-12 DIAGNOSIS — Z45010 Encounter for checking and testing of cardiac pacemaker pulse generator [battery]: Secondary | ICD-10-CM | POA: Diagnosis not present

## 2016-08-12 DIAGNOSIS — I10 Essential (primary) hypertension: Secondary | ICD-10-CM | POA: Diagnosis not present

## 2016-08-12 DIAGNOSIS — Z950 Presence of cardiac pacemaker: Secondary | ICD-10-CM | POA: Diagnosis not present

## 2016-08-12 DIAGNOSIS — E079 Disorder of thyroid, unspecified: Secondary | ICD-10-CM | POA: Diagnosis not present

## 2016-08-12 DIAGNOSIS — Z9889 Other specified postprocedural states: Secondary | ICD-10-CM | POA: Diagnosis not present

## 2016-08-12 DIAGNOSIS — I482 Chronic atrial fibrillation: Secondary | ICD-10-CM | POA: Diagnosis not present

## 2016-08-13 DIAGNOSIS — M5126 Other intervertebral disc displacement, lumbar region: Secondary | ICD-10-CM | POA: Diagnosis not present

## 2016-09-10 DIAGNOSIS — G894 Chronic pain syndrome: Secondary | ICD-10-CM | POA: Diagnosis not present

## 2016-09-10 DIAGNOSIS — M47817 Spondylosis without myelopathy or radiculopathy, lumbosacral region: Secondary | ICD-10-CM | POA: Diagnosis not present

## 2016-09-10 DIAGNOSIS — M546 Pain in thoracic spine: Secondary | ICD-10-CM | POA: Diagnosis not present

## 2016-09-11 DIAGNOSIS — Z950 Presence of cardiac pacemaker: Secondary | ICD-10-CM | POA: Diagnosis not present

## 2016-09-11 DIAGNOSIS — I700 Atherosclerosis of aorta: Secondary | ICD-10-CM | POA: Diagnosis not present

## 2016-09-11 DIAGNOSIS — Z45018 Encounter for adjustment and management of other part of cardiac pacemaker: Secondary | ICD-10-CM | POA: Diagnosis not present

## 2016-09-11 DIAGNOSIS — Z45010 Encounter for checking and testing of cardiac pacemaker pulse generator [battery]: Secondary | ICD-10-CM | POA: Diagnosis not present

## 2016-09-11 DIAGNOSIS — Z01818 Encounter for other preprocedural examination: Secondary | ICD-10-CM | POA: Diagnosis not present

## 2016-09-11 DIAGNOSIS — J9811 Atelectasis: Secondary | ICD-10-CM | POA: Diagnosis not present

## 2016-09-17 DIAGNOSIS — I10 Essential (primary) hypertension: Secondary | ICD-10-CM | POA: Diagnosis not present

## 2016-09-17 DIAGNOSIS — Z8249 Family history of ischemic heart disease and other diseases of the circulatory system: Secondary | ICD-10-CM | POA: Diagnosis not present

## 2016-09-17 DIAGNOSIS — E039 Hypothyroidism, unspecified: Secondary | ICD-10-CM | POA: Diagnosis not present

## 2016-09-17 DIAGNOSIS — Z9889 Other specified postprocedural states: Secondary | ICD-10-CM | POA: Diagnosis not present

## 2016-09-17 DIAGNOSIS — Z45010 Encounter for checking and testing of cardiac pacemaker pulse generator [battery]: Secondary | ICD-10-CM | POA: Diagnosis not present

## 2016-09-17 DIAGNOSIS — Z7901 Long term (current) use of anticoagulants: Secondary | ICD-10-CM | POA: Diagnosis not present

## 2016-09-17 DIAGNOSIS — Z79899 Other long term (current) drug therapy: Secondary | ICD-10-CM | POA: Diagnosis not present

## 2016-09-17 DIAGNOSIS — I495 Sick sinus syndrome: Secondary | ICD-10-CM | POA: Diagnosis not present

## 2016-09-17 DIAGNOSIS — Z23 Encounter for immunization: Secondary | ICD-10-CM | POA: Diagnosis not present

## 2016-09-18 DIAGNOSIS — I495 Sick sinus syndrome: Secondary | ICD-10-CM | POA: Diagnosis not present

## 2016-09-18 DIAGNOSIS — Z45010 Encounter for checking and testing of cardiac pacemaker pulse generator [battery]: Secondary | ICD-10-CM | POA: Diagnosis not present

## 2016-09-18 DIAGNOSIS — E039 Hypothyroidism, unspecified: Secondary | ICD-10-CM | POA: Diagnosis not present

## 2016-09-18 DIAGNOSIS — Z23 Encounter for immunization: Secondary | ICD-10-CM | POA: Diagnosis not present

## 2016-09-18 DIAGNOSIS — Z8249 Family history of ischemic heart disease and other diseases of the circulatory system: Secondary | ICD-10-CM | POA: Diagnosis not present

## 2016-09-18 DIAGNOSIS — I10 Essential (primary) hypertension: Secondary | ICD-10-CM | POA: Diagnosis not present

## 2016-09-30 DIAGNOSIS — Z9889 Other specified postprocedural states: Secondary | ICD-10-CM | POA: Diagnosis not present

## 2016-09-30 DIAGNOSIS — Z45018 Encounter for adjustment and management of other part of cardiac pacemaker: Secondary | ICD-10-CM | POA: Diagnosis not present

## 2016-09-30 DIAGNOSIS — I482 Chronic atrial fibrillation: Secondary | ICD-10-CM | POA: Diagnosis not present

## 2016-10-03 DIAGNOSIS — N3000 Acute cystitis without hematuria: Secondary | ICD-10-CM | POA: Diagnosis not present

## 2016-10-15 DIAGNOSIS — M47814 Spondylosis without myelopathy or radiculopathy, thoracic region: Secondary | ICD-10-CM | POA: Diagnosis not present

## 2016-11-06 DIAGNOSIS — M545 Low back pain: Secondary | ICD-10-CM | POA: Diagnosis not present

## 2016-11-06 DIAGNOSIS — M549 Dorsalgia, unspecified: Secondary | ICD-10-CM | POA: Diagnosis not present

## 2016-11-07 DIAGNOSIS — M549 Dorsalgia, unspecified: Secondary | ICD-10-CM | POA: Diagnosis not present

## 2016-11-08 DIAGNOSIS — M549 Dorsalgia, unspecified: Secondary | ICD-10-CM | POA: Diagnosis not present

## 2016-11-08 DIAGNOSIS — M545 Low back pain: Secondary | ICD-10-CM | POA: Diagnosis not present

## 2016-11-11 DIAGNOSIS — M545 Low back pain: Secondary | ICD-10-CM | POA: Diagnosis not present

## 2016-11-11 DIAGNOSIS — M549 Dorsalgia, unspecified: Secondary | ICD-10-CM | POA: Diagnosis not present

## 2016-11-13 DIAGNOSIS — M549 Dorsalgia, unspecified: Secondary | ICD-10-CM | POA: Diagnosis not present

## 2016-11-13 DIAGNOSIS — M545 Low back pain: Secondary | ICD-10-CM | POA: Diagnosis not present

## 2016-11-21 DIAGNOSIS — M545 Low back pain: Secondary | ICD-10-CM | POA: Diagnosis not present

## 2016-11-21 DIAGNOSIS — M549 Dorsalgia, unspecified: Secondary | ICD-10-CM | POA: Diagnosis not present

## 2016-11-22 DIAGNOSIS — M545 Low back pain: Secondary | ICD-10-CM | POA: Diagnosis not present

## 2016-11-22 DIAGNOSIS — M549 Dorsalgia, unspecified: Secondary | ICD-10-CM | POA: Diagnosis not present

## 2016-11-26 DIAGNOSIS — M549 Dorsalgia, unspecified: Secondary | ICD-10-CM | POA: Diagnosis not present

## 2016-11-26 DIAGNOSIS — M545 Low back pain: Secondary | ICD-10-CM | POA: Diagnosis not present

## 2016-12-05 DIAGNOSIS — M545 Low back pain: Secondary | ICD-10-CM | POA: Diagnosis not present

## 2016-12-05 DIAGNOSIS — M549 Dorsalgia, unspecified: Secondary | ICD-10-CM | POA: Diagnosis not present

## 2016-12-09 DIAGNOSIS — M545 Low back pain: Secondary | ICD-10-CM | POA: Diagnosis not present

## 2016-12-09 DIAGNOSIS — M549 Dorsalgia, unspecified: Secondary | ICD-10-CM | POA: Diagnosis not present

## 2016-12-10 DIAGNOSIS — M47814 Spondylosis without myelopathy or radiculopathy, thoracic region: Secondary | ICD-10-CM | POA: Diagnosis not present

## 2016-12-16 DIAGNOSIS — M549 Dorsalgia, unspecified: Secondary | ICD-10-CM | POA: Diagnosis not present

## 2016-12-16 DIAGNOSIS — M545 Low back pain: Secondary | ICD-10-CM | POA: Diagnosis not present

## 2016-12-19 DIAGNOSIS — M545 Low back pain: Secondary | ICD-10-CM | POA: Diagnosis not present

## 2016-12-19 DIAGNOSIS — M549 Dorsalgia, unspecified: Secondary | ICD-10-CM | POA: Diagnosis not present

## 2016-12-23 DIAGNOSIS — M545 Low back pain: Secondary | ICD-10-CM | POA: Diagnosis not present

## 2016-12-23 DIAGNOSIS — M549 Dorsalgia, unspecified: Secondary | ICD-10-CM | POA: Diagnosis not present

## 2016-12-26 DIAGNOSIS — M545 Low back pain: Secondary | ICD-10-CM | POA: Diagnosis not present

## 2016-12-26 DIAGNOSIS — M549 Dorsalgia, unspecified: Secondary | ICD-10-CM | POA: Diagnosis not present

## 2016-12-30 DIAGNOSIS — M545 Low back pain: Secondary | ICD-10-CM | POA: Diagnosis not present

## 2016-12-30 DIAGNOSIS — M549 Dorsalgia, unspecified: Secondary | ICD-10-CM | POA: Diagnosis not present

## 2016-12-31 DIAGNOSIS — Z950 Presence of cardiac pacemaker: Secondary | ICD-10-CM | POA: Diagnosis not present

## 2016-12-31 DIAGNOSIS — I509 Heart failure, unspecified: Secondary | ICD-10-CM | POA: Diagnosis not present

## 2017-01-01 DIAGNOSIS — M549 Dorsalgia, unspecified: Secondary | ICD-10-CM | POA: Diagnosis not present

## 2017-01-01 DIAGNOSIS — M545 Low back pain: Secondary | ICD-10-CM | POA: Diagnosis not present

## 2017-01-03 DIAGNOSIS — M549 Dorsalgia, unspecified: Secondary | ICD-10-CM | POA: Diagnosis not present

## 2017-01-03 DIAGNOSIS — G8929 Other chronic pain: Secondary | ICD-10-CM | POA: Diagnosis not present

## 2017-01-03 DIAGNOSIS — M62830 Muscle spasm of back: Secondary | ICD-10-CM | POA: Diagnosis not present

## 2017-01-14 DIAGNOSIS — E039 Hypothyroidism, unspecified: Secondary | ICD-10-CM | POA: Diagnosis not present

## 2017-01-14 DIAGNOSIS — I10 Essential (primary) hypertension: Secondary | ICD-10-CM | POA: Diagnosis not present

## 2017-01-14 DIAGNOSIS — D72829 Elevated white blood cell count, unspecified: Secondary | ICD-10-CM | POA: Diagnosis not present

## 2017-01-14 DIAGNOSIS — R112 Nausea with vomiting, unspecified: Secondary | ICD-10-CM | POA: Diagnosis not present

## 2017-01-14 DIAGNOSIS — I509 Heart failure, unspecified: Secondary | ICD-10-CM | POA: Diagnosis present

## 2017-01-14 DIAGNOSIS — E860 Dehydration: Secondary | ICD-10-CM | POA: Diagnosis not present

## 2017-01-14 DIAGNOSIS — Z950 Presence of cardiac pacemaker: Secondary | ICD-10-CM | POA: Diagnosis not present

## 2017-01-14 DIAGNOSIS — E876 Hypokalemia: Secondary | ICD-10-CM | POA: Diagnosis not present

## 2017-01-14 DIAGNOSIS — I4891 Unspecified atrial fibrillation: Secondary | ICD-10-CM | POA: Diagnosis not present

## 2017-01-14 DIAGNOSIS — N390 Urinary tract infection, site not specified: Secondary | ICD-10-CM | POA: Diagnosis not present

## 2017-01-14 DIAGNOSIS — R197 Diarrhea, unspecified: Secondary | ICD-10-CM | POA: Diagnosis not present

## 2017-01-14 DIAGNOSIS — Z7902 Long term (current) use of antithrombotics/antiplatelets: Secondary | ICD-10-CM | POA: Diagnosis not present

## 2017-01-14 DIAGNOSIS — N3001 Acute cystitis with hematuria: Secondary | ICD-10-CM | POA: Diagnosis not present

## 2017-01-14 DIAGNOSIS — I110 Hypertensive heart disease with heart failure: Secondary | ICD-10-CM | POA: Diagnosis not present

## 2017-01-14 DIAGNOSIS — Z79899 Other long term (current) drug therapy: Secondary | ICD-10-CM | POA: Diagnosis not present

## 2017-01-14 DIAGNOSIS — D649 Anemia, unspecified: Secondary | ICD-10-CM | POA: Diagnosis not present

## 2017-01-14 DIAGNOSIS — G9340 Encephalopathy, unspecified: Secondary | ICD-10-CM | POA: Diagnosis not present

## 2017-01-14 DIAGNOSIS — F418 Other specified anxiety disorders: Secondary | ICD-10-CM | POA: Diagnosis present

## 2017-01-14 DIAGNOSIS — R4182 Altered mental status, unspecified: Secondary | ICD-10-CM | POA: Diagnosis not present

## 2017-01-16 DIAGNOSIS — D72829 Elevated white blood cell count, unspecified: Secondary | ICD-10-CM

## 2017-01-20 DIAGNOSIS — Z87440 Personal history of urinary (tract) infections: Secondary | ICD-10-CM | POA: Diagnosis not present

## 2017-01-20 DIAGNOSIS — F329 Major depressive disorder, single episode, unspecified: Secondary | ICD-10-CM | POA: Diagnosis not present

## 2017-01-20 DIAGNOSIS — E039 Hypothyroidism, unspecified: Secondary | ICD-10-CM | POA: Diagnosis not present

## 2017-01-20 DIAGNOSIS — I509 Heart failure, unspecified: Secondary | ICD-10-CM | POA: Diagnosis not present

## 2017-01-20 DIAGNOSIS — I4891 Unspecified atrial fibrillation: Secondary | ICD-10-CM | POA: Diagnosis not present

## 2017-01-20 DIAGNOSIS — F419 Anxiety disorder, unspecified: Secondary | ICD-10-CM | POA: Diagnosis not present

## 2017-01-20 DIAGNOSIS — D649 Anemia, unspecified: Secondary | ICD-10-CM | POA: Diagnosis not present

## 2017-01-20 DIAGNOSIS — I110 Hypertensive heart disease with heart failure: Secondary | ICD-10-CM | POA: Diagnosis not present

## 2017-01-20 DIAGNOSIS — Z9181 History of falling: Secondary | ICD-10-CM | POA: Diagnosis not present

## 2017-01-20 DIAGNOSIS — Z7901 Long term (current) use of anticoagulants: Secondary | ICD-10-CM | POA: Diagnosis not present

## 2017-01-21 DIAGNOSIS — D72821 Monocytosis (symptomatic): Secondary | ICD-10-CM | POA: Diagnosis not present

## 2017-01-21 DIAGNOSIS — D509 Iron deficiency anemia, unspecified: Secondary | ICD-10-CM | POA: Diagnosis not present

## 2017-01-21 DIAGNOSIS — D72829 Elevated white blood cell count, unspecified: Secondary | ICD-10-CM | POA: Diagnosis not present

## 2017-01-22 DIAGNOSIS — E039 Hypothyroidism, unspecified: Secondary | ICD-10-CM | POA: Diagnosis not present

## 2017-01-22 DIAGNOSIS — Z79899 Other long term (current) drug therapy: Secondary | ICD-10-CM | POA: Diagnosis not present

## 2017-01-22 DIAGNOSIS — I4891 Unspecified atrial fibrillation: Secondary | ICD-10-CM | POA: Diagnosis not present

## 2017-01-22 DIAGNOSIS — F329 Major depressive disorder, single episode, unspecified: Secondary | ICD-10-CM | POA: Diagnosis not present

## 2017-01-22 DIAGNOSIS — E785 Hyperlipidemia, unspecified: Secondary | ICD-10-CM | POA: Diagnosis not present

## 2017-01-22 DIAGNOSIS — D72829 Elevated white blood cell count, unspecified: Secondary | ICD-10-CM | POA: Diagnosis not present

## 2017-01-22 DIAGNOSIS — N390 Urinary tract infection, site not specified: Secondary | ICD-10-CM | POA: Diagnosis not present

## 2017-01-22 DIAGNOSIS — I509 Heart failure, unspecified: Secondary | ICD-10-CM | POA: Diagnosis not present

## 2017-01-22 DIAGNOSIS — D649 Anemia, unspecified: Secondary | ICD-10-CM | POA: Diagnosis not present

## 2017-01-22 DIAGNOSIS — I110 Hypertensive heart disease with heart failure: Secondary | ICD-10-CM | POA: Diagnosis not present

## 2017-01-23 DIAGNOSIS — E039 Hypothyroidism, unspecified: Secondary | ICD-10-CM | POA: Diagnosis not present

## 2017-01-23 DIAGNOSIS — I4891 Unspecified atrial fibrillation: Secondary | ICD-10-CM | POA: Diagnosis not present

## 2017-01-23 DIAGNOSIS — F329 Major depressive disorder, single episode, unspecified: Secondary | ICD-10-CM | POA: Diagnosis not present

## 2017-01-23 DIAGNOSIS — I509 Heart failure, unspecified: Secondary | ICD-10-CM | POA: Diagnosis not present

## 2017-01-23 DIAGNOSIS — I110 Hypertensive heart disease with heart failure: Secondary | ICD-10-CM | POA: Diagnosis not present

## 2017-01-23 DIAGNOSIS — D649 Anemia, unspecified: Secondary | ICD-10-CM | POA: Diagnosis not present

## 2017-01-24 DIAGNOSIS — I4891 Unspecified atrial fibrillation: Secondary | ICD-10-CM | POA: Diagnosis not present

## 2017-01-24 DIAGNOSIS — F329 Major depressive disorder, single episode, unspecified: Secondary | ICD-10-CM | POA: Diagnosis not present

## 2017-01-24 DIAGNOSIS — E039 Hypothyroidism, unspecified: Secondary | ICD-10-CM | POA: Diagnosis not present

## 2017-01-24 DIAGNOSIS — I509 Heart failure, unspecified: Secondary | ICD-10-CM | POA: Diagnosis not present

## 2017-01-24 DIAGNOSIS — D649 Anemia, unspecified: Secondary | ICD-10-CM | POA: Diagnosis not present

## 2017-01-24 DIAGNOSIS — I110 Hypertensive heart disease with heart failure: Secondary | ICD-10-CM | POA: Diagnosis not present

## 2017-01-28 DIAGNOSIS — D649 Anemia, unspecified: Secondary | ICD-10-CM | POA: Diagnosis not present

## 2017-01-28 DIAGNOSIS — I509 Heart failure, unspecified: Secondary | ICD-10-CM | POA: Diagnosis not present

## 2017-01-28 DIAGNOSIS — I110 Hypertensive heart disease with heart failure: Secondary | ICD-10-CM | POA: Diagnosis not present

## 2017-01-28 DIAGNOSIS — F329 Major depressive disorder, single episode, unspecified: Secondary | ICD-10-CM | POA: Diagnosis not present

## 2017-01-28 DIAGNOSIS — I4891 Unspecified atrial fibrillation: Secondary | ICD-10-CM | POA: Diagnosis not present

## 2017-01-28 DIAGNOSIS — E039 Hypothyroidism, unspecified: Secondary | ICD-10-CM | POA: Diagnosis not present

## 2017-01-30 DIAGNOSIS — D649 Anemia, unspecified: Secondary | ICD-10-CM | POA: Diagnosis not present

## 2017-01-30 DIAGNOSIS — I110 Hypertensive heart disease with heart failure: Secondary | ICD-10-CM | POA: Diagnosis not present

## 2017-01-30 DIAGNOSIS — E039 Hypothyroidism, unspecified: Secondary | ICD-10-CM | POA: Diagnosis not present

## 2017-01-30 DIAGNOSIS — I4891 Unspecified atrial fibrillation: Secondary | ICD-10-CM | POA: Diagnosis not present

## 2017-01-30 DIAGNOSIS — I509 Heart failure, unspecified: Secondary | ICD-10-CM | POA: Diagnosis not present

## 2017-01-30 DIAGNOSIS — F329 Major depressive disorder, single episode, unspecified: Secondary | ICD-10-CM | POA: Diagnosis not present

## 2017-01-31 DIAGNOSIS — E039 Hypothyroidism, unspecified: Secondary | ICD-10-CM | POA: Diagnosis not present

## 2017-01-31 DIAGNOSIS — I4891 Unspecified atrial fibrillation: Secondary | ICD-10-CM | POA: Diagnosis not present

## 2017-01-31 DIAGNOSIS — I110 Hypertensive heart disease with heart failure: Secondary | ICD-10-CM | POA: Diagnosis not present

## 2017-01-31 DIAGNOSIS — I509 Heart failure, unspecified: Secondary | ICD-10-CM | POA: Diagnosis not present

## 2017-01-31 DIAGNOSIS — D649 Anemia, unspecified: Secondary | ICD-10-CM | POA: Diagnosis not present

## 2017-01-31 DIAGNOSIS — F329 Major depressive disorder, single episode, unspecified: Secondary | ICD-10-CM | POA: Diagnosis not present

## 2017-02-03 DIAGNOSIS — E039 Hypothyroidism, unspecified: Secondary | ICD-10-CM | POA: Diagnosis not present

## 2017-02-03 DIAGNOSIS — F329 Major depressive disorder, single episode, unspecified: Secondary | ICD-10-CM | POA: Diagnosis not present

## 2017-02-03 DIAGNOSIS — I110 Hypertensive heart disease with heart failure: Secondary | ICD-10-CM | POA: Diagnosis not present

## 2017-02-03 DIAGNOSIS — D649 Anemia, unspecified: Secondary | ICD-10-CM | POA: Diagnosis not present

## 2017-02-03 DIAGNOSIS — I4891 Unspecified atrial fibrillation: Secondary | ICD-10-CM | POA: Diagnosis not present

## 2017-02-03 DIAGNOSIS — I509 Heart failure, unspecified: Secondary | ICD-10-CM | POA: Diagnosis not present

## 2017-02-04 DIAGNOSIS — E039 Hypothyroidism, unspecified: Secondary | ICD-10-CM | POA: Diagnosis not present

## 2017-02-04 DIAGNOSIS — F329 Major depressive disorder, single episode, unspecified: Secondary | ICD-10-CM | POA: Diagnosis not present

## 2017-02-04 DIAGNOSIS — D649 Anemia, unspecified: Secondary | ICD-10-CM | POA: Diagnosis not present

## 2017-02-04 DIAGNOSIS — I110 Hypertensive heart disease with heart failure: Secondary | ICD-10-CM | POA: Diagnosis not present

## 2017-02-04 DIAGNOSIS — I509 Heart failure, unspecified: Secondary | ICD-10-CM | POA: Diagnosis not present

## 2017-02-04 DIAGNOSIS — I4891 Unspecified atrial fibrillation: Secondary | ICD-10-CM | POA: Diagnosis not present

## 2017-02-06 DIAGNOSIS — I509 Heart failure, unspecified: Secondary | ICD-10-CM | POA: Diagnosis not present

## 2017-02-06 DIAGNOSIS — E039 Hypothyroidism, unspecified: Secondary | ICD-10-CM | POA: Diagnosis not present

## 2017-02-06 DIAGNOSIS — I110 Hypertensive heart disease with heart failure: Secondary | ICD-10-CM | POA: Diagnosis not present

## 2017-02-06 DIAGNOSIS — F329 Major depressive disorder, single episode, unspecified: Secondary | ICD-10-CM | POA: Diagnosis not present

## 2017-02-06 DIAGNOSIS — I4891 Unspecified atrial fibrillation: Secondary | ICD-10-CM | POA: Diagnosis not present

## 2017-02-06 DIAGNOSIS — D649 Anemia, unspecified: Secondary | ICD-10-CM | POA: Diagnosis not present

## 2017-02-11 DIAGNOSIS — E039 Hypothyroidism, unspecified: Secondary | ICD-10-CM | POA: Diagnosis not present

## 2017-02-11 DIAGNOSIS — F329 Major depressive disorder, single episode, unspecified: Secondary | ICD-10-CM | POA: Diagnosis not present

## 2017-02-11 DIAGNOSIS — I4891 Unspecified atrial fibrillation: Secondary | ICD-10-CM | POA: Diagnosis not present

## 2017-02-11 DIAGNOSIS — I110 Hypertensive heart disease with heart failure: Secondary | ICD-10-CM | POA: Diagnosis not present

## 2017-02-11 DIAGNOSIS — D649 Anemia, unspecified: Secondary | ICD-10-CM | POA: Diagnosis not present

## 2017-02-11 DIAGNOSIS — I509 Heart failure, unspecified: Secondary | ICD-10-CM | POA: Diagnosis not present

## 2017-02-14 DIAGNOSIS — I110 Hypertensive heart disease with heart failure: Secondary | ICD-10-CM | POA: Diagnosis not present

## 2017-02-14 DIAGNOSIS — I509 Heart failure, unspecified: Secondary | ICD-10-CM | POA: Diagnosis not present

## 2017-02-14 DIAGNOSIS — F329 Major depressive disorder, single episode, unspecified: Secondary | ICD-10-CM | POA: Diagnosis not present

## 2017-02-14 DIAGNOSIS — E039 Hypothyroidism, unspecified: Secondary | ICD-10-CM | POA: Diagnosis not present

## 2017-02-14 DIAGNOSIS — I4891 Unspecified atrial fibrillation: Secondary | ICD-10-CM | POA: Diagnosis not present

## 2017-02-14 DIAGNOSIS — D649 Anemia, unspecified: Secondary | ICD-10-CM | POA: Diagnosis not present

## 2017-02-17 DIAGNOSIS — N3000 Acute cystitis without hematuria: Secondary | ICD-10-CM | POA: Diagnosis not present

## 2017-02-18 DIAGNOSIS — D72821 Monocytosis (symptomatic): Secondary | ICD-10-CM | POA: Diagnosis not present

## 2017-02-18 DIAGNOSIS — D72829 Elevated white blood cell count, unspecified: Secondary | ICD-10-CM | POA: Diagnosis not present

## 2017-02-19 ENCOUNTER — Other Ambulatory Visit (HOSPITAL_COMMUNITY)
Admission: RE | Admit: 2017-02-19 | Disposition: A | Discharge status: Home / Self Care | Payer: Medicare Other | Source: Ambulatory Visit | Attending: Oncology | Admitting: Oncology

## 2017-02-19 DIAGNOSIS — I110 Hypertensive heart disease with heart failure: Secondary | ICD-10-CM | POA: Diagnosis not present

## 2017-02-19 DIAGNOSIS — I509 Heart failure, unspecified: Secondary | ICD-10-CM | POA: Diagnosis not present

## 2017-02-19 DIAGNOSIS — C9210 Chronic myeloid leukemia, BCR/ABL-positive, not having achieved remission: Secondary | ICD-10-CM | POA: Diagnosis not present

## 2017-02-19 DIAGNOSIS — E039 Hypothyroidism, unspecified: Secondary | ICD-10-CM | POA: Diagnosis not present

## 2017-02-19 DIAGNOSIS — D649 Anemia, unspecified: Secondary | ICD-10-CM | POA: Diagnosis not present

## 2017-02-19 DIAGNOSIS — D72821 Monocytosis (symptomatic): Secondary | ICD-10-CM | POA: Diagnosis not present

## 2017-02-19 DIAGNOSIS — I4891 Unspecified atrial fibrillation: Secondary | ICD-10-CM | POA: Diagnosis not present

## 2017-02-19 DIAGNOSIS — D72829 Elevated white blood cell count, unspecified: Secondary | ICD-10-CM | POA: Diagnosis not present

## 2017-02-19 DIAGNOSIS — F329 Major depressive disorder, single episode, unspecified: Secondary | ICD-10-CM | POA: Diagnosis not present

## 2017-02-20 DIAGNOSIS — C9210 Chronic myeloid leukemia, BCR/ABL-positive, not having achieved remission: Secondary | ICD-10-CM | POA: Diagnosis not present

## 2017-02-25 DIAGNOSIS — D649 Anemia, unspecified: Secondary | ICD-10-CM | POA: Diagnosis not present

## 2017-02-25 DIAGNOSIS — E039 Hypothyroidism, unspecified: Secondary | ICD-10-CM | POA: Diagnosis not present

## 2017-02-25 DIAGNOSIS — I110 Hypertensive heart disease with heart failure: Secondary | ICD-10-CM | POA: Diagnosis not present

## 2017-02-25 DIAGNOSIS — I4891 Unspecified atrial fibrillation: Secondary | ICD-10-CM | POA: Diagnosis not present

## 2017-02-25 DIAGNOSIS — F329 Major depressive disorder, single episode, unspecified: Secondary | ICD-10-CM | POA: Diagnosis not present

## 2017-02-25 DIAGNOSIS — I509 Heart failure, unspecified: Secondary | ICD-10-CM | POA: Diagnosis not present

## 2017-03-04 LAB — CHROMOSOME ANALYSIS, BONE MARROW

## 2017-03-05 DIAGNOSIS — D72829 Elevated white blood cell count, unspecified: Secondary | ICD-10-CM | POA: Diagnosis not present

## 2017-03-05 DIAGNOSIS — C9210 Chronic myeloid leukemia, BCR/ABL-positive, not having achieved remission: Secondary | ICD-10-CM | POA: Diagnosis not present

## 2017-03-11 DIAGNOSIS — C9210 Chronic myeloid leukemia, BCR/ABL-positive, not having achieved remission: Secondary | ICD-10-CM | POA: Diagnosis not present

## 2017-03-12 ENCOUNTER — Encounter (HOSPITAL_COMMUNITY): Payer: Self-pay

## 2017-04-01 DIAGNOSIS — I509 Heart failure, unspecified: Secondary | ICD-10-CM | POA: Diagnosis not present

## 2017-04-01 DIAGNOSIS — Z950 Presence of cardiac pacemaker: Secondary | ICD-10-CM | POA: Diagnosis not present

## 2017-04-08 DIAGNOSIS — E039 Hypothyroidism, unspecified: Secondary | ICD-10-CM | POA: Diagnosis not present

## 2017-04-08 DIAGNOSIS — I428 Other cardiomyopathies: Secondary | ICD-10-CM | POA: Diagnosis not present

## 2017-04-08 DIAGNOSIS — K922 Gastrointestinal hemorrhage, unspecified: Secondary | ICD-10-CM | POA: Diagnosis not present

## 2017-04-08 DIAGNOSIS — Z79899 Other long term (current) drug therapy: Secondary | ICD-10-CM | POA: Diagnosis not present

## 2017-04-08 DIAGNOSIS — I480 Paroxysmal atrial fibrillation: Secondary | ICD-10-CM | POA: Diagnosis not present

## 2017-04-08 DIAGNOSIS — E785 Hyperlipidemia, unspecified: Secondary | ICD-10-CM | POA: Diagnosis not present

## 2017-04-15 DIAGNOSIS — D649 Anemia, unspecified: Secondary | ICD-10-CM | POA: Diagnosis not present

## 2017-04-15 DIAGNOSIS — I4891 Unspecified atrial fibrillation: Secondary | ICD-10-CM | POA: Diagnosis not present

## 2017-04-15 DIAGNOSIS — C9210 Chronic myeloid leukemia, BCR/ABL-positive, not having achieved remission: Secondary | ICD-10-CM | POA: Diagnosis not present

## 2017-04-22 DIAGNOSIS — C9210 Chronic myeloid leukemia, BCR/ABL-positive, not having achieved remission: Secondary | ICD-10-CM | POA: Diagnosis not present

## 2017-04-22 DIAGNOSIS — D500 Iron deficiency anemia secondary to blood loss (chronic): Secondary | ICD-10-CM | POA: Diagnosis not present

## 2017-04-29 DIAGNOSIS — C9210 Chronic myeloid leukemia, BCR/ABL-positive, not having achieved remission: Secondary | ICD-10-CM | POA: Diagnosis not present

## 2017-05-16 DIAGNOSIS — C9210 Chronic myeloid leukemia, BCR/ABL-positive, not having achieved remission: Secondary | ICD-10-CM | POA: Diagnosis not present

## 2017-05-16 DIAGNOSIS — Z515 Encounter for palliative care: Secondary | ICD-10-CM | POA: Diagnosis not present

## 2017-05-20 DIAGNOSIS — D500 Iron deficiency anemia secondary to blood loss (chronic): Secondary | ICD-10-CM | POA: Diagnosis not present

## 2017-06-13 DIAGNOSIS — C9210 Chronic myeloid leukemia, BCR/ABL-positive, not having achieved remission: Secondary | ICD-10-CM | POA: Diagnosis not present

## 2017-06-22 DIAGNOSIS — I10 Essential (primary) hypertension: Secondary | ICD-10-CM | POA: Diagnosis not present

## 2017-06-22 DIAGNOSIS — M549 Dorsalgia, unspecified: Secondary | ICD-10-CM | POA: Diagnosis not present

## 2017-06-22 DIAGNOSIS — I517 Cardiomegaly: Secondary | ICD-10-CM | POA: Diagnosis not present

## 2017-06-22 DIAGNOSIS — J90 Pleural effusion, not elsewhere classified: Secondary | ICD-10-CM | POA: Diagnosis not present

## 2017-06-22 DIAGNOSIS — Z950 Presence of cardiac pacemaker: Secondary | ICD-10-CM | POA: Diagnosis not present

## 2017-06-22 DIAGNOSIS — R4182 Altered mental status, unspecified: Secondary | ICD-10-CM | POA: Diagnosis not present

## 2017-06-22 DIAGNOSIS — G8929 Other chronic pain: Secondary | ICD-10-CM | POA: Diagnosis not present

## 2017-06-22 DIAGNOSIS — F0390 Unspecified dementia without behavioral disturbance: Secondary | ICD-10-CM | POA: Diagnosis not present

## 2017-06-24 DIAGNOSIS — Z6826 Body mass index (BMI) 26.0-26.9, adult: Secondary | ICD-10-CM | POA: Diagnosis not present

## 2017-06-24 DIAGNOSIS — R4182 Altered mental status, unspecified: Secondary | ICD-10-CM | POA: Diagnosis not present

## 2017-06-24 DIAGNOSIS — M549 Dorsalgia, unspecified: Secondary | ICD-10-CM | POA: Diagnosis not present

## 2017-06-24 DIAGNOSIS — G8929 Other chronic pain: Secondary | ICD-10-CM | POA: Diagnosis not present

## 2017-06-27 DIAGNOSIS — F0390 Unspecified dementia without behavioral disturbance: Secondary | ICD-10-CM | POA: Diagnosis not present

## 2017-06-27 DIAGNOSIS — C9210 Chronic myeloid leukemia, BCR/ABL-positive, not having achieved remission: Secondary | ICD-10-CM | POA: Diagnosis not present

## 2017-07-01 DIAGNOSIS — I509 Heart failure, unspecified: Secondary | ICD-10-CM | POA: Diagnosis not present

## 2017-07-01 DIAGNOSIS — Z950 Presence of cardiac pacemaker: Secondary | ICD-10-CM | POA: Diagnosis not present

## 2017-08-13 ENCOUNTER — Encounter: Payer: Self-pay | Admitting: Neurology

## 2017-08-13 ENCOUNTER — Ambulatory Visit (INDEPENDENT_AMBULATORY_CARE_PROVIDER_SITE_OTHER): Payer: Medicare Other | Admitting: Neurology

## 2017-08-13 DIAGNOSIS — G3184 Mild cognitive impairment, so stated: Secondary | ICD-10-CM | POA: Diagnosis not present

## 2017-08-13 DIAGNOSIS — E538 Deficiency of other specified B group vitamins: Secondary | ICD-10-CM | POA: Diagnosis not present

## 2017-08-13 MED ORDER — MEMANTINE HCL 10 MG PO TABS
10.0000 mg | ORAL_TABLET | Freq: Two times a day (BID) | ORAL | 11 refills | Status: DC
Start: 2017-08-13 — End: 2017-11-12

## 2017-08-13 NOTE — Progress Notes (Signed)
PATIENT: Joanna Herrera DOB: 1930/08/03  Chief Complaint  Patient presents with  . Memory Loss    MMSE 24/30 - 9 animals.  She is here with her son, Joanna Herrera and daughter-in-law, Joanna Herrera.  Reports episode of worsening memory in July that was felt to be related to medication side effects.  She was taken off baclofen and she improved. In general, she only has problems with short term memory.  Marland Kitchen PCP    Street, Joanna Mt, MD     HISTORICAL  Joanna Herrera is 83 years old female accompanied by her son Joanna Herrera and daughter-in-law Joanna Herrera, seen in refer by his primary care Joanna Herrera for evaluation of memory loss, initial evaluation was on August 13 2017,  She had past medical history of hypothyroidism, on supplement, hypertension,  history of atrial fibrillation,Status post ablation, also had pacemaker,  She lives alone, still drives short distances, had chronic low back pain, for a while she was taking baclofen up to 80 mg daily, it was stopped in July 2018, also gabapentin 100 mg 4 times a day,  She suffered a urinary tract infection in February 2018, was noted to have increased confusion, she presented to [facility] on June 22 2017 after found by her family that she was confused, could not recognize her family members, there was no evidence of active infection this time,  She retired from Actuary job, 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, lives alone, was noted to have gradually onset memory loss, it has become increasingly difficult for her to manage her medications, also get lost while driving sometimes,   Laboratory evaluations in July 2018 showed CBC with hemoglobin of 12.1, CMP showed creatinine of 0.78, mild decreased TSH 4.8, normal free T4,   CT chest showed right base atelectasis and right effusion,  CT head without contrast showing no acute abnormality, generalized atrophy  REVIEW OF SYSTEMS: Full 14 system review of systems performed and notable only for swelling  in legs, easy bruising, back pain, memory loss, confusion  ALLERGIES: No Known Allergies  HOME MEDICATIONS: Current Outpatient Prescriptions  Medication Sig Dispense Refill  . acetaminophen (TYLENOL) 500 MG tablet Take 1,000 mg by mouth.    Marland Kitchen aspirin 81 MG tablet Take 81 mg by mouth daily.    . DULoxetine (CYMBALTA) 30 MG capsule Take 30 mg by mouth daily.     . furosemide (LASIX) 20 MG tablet Take 20 mg by mouth.    . gabapentin (NEURONTIN) 100 MG capsule Take 100 mg by mouth 4 (four) times daily.    . hydrochlorothiazide (HYDRODIURIL) 25 MG tablet Take 25 mg by mouth daily.    . IRON PO Take 250 mg by mouth daily.    Marland Kitchen levothyroxine (SYNTHROID, LEVOTHROID) 75 MCG tablet Take by mouth.    Marland Kitchen lisinopril (PRINIVIL,ZESTRIL) 2.5 MG tablet Take 1 tablet by mouth daily.    . metoprolol succinate (TOPROL-XL) 50 MG 24 hr tablet Take 50 mg by mouth daily. Take with or immediately following a meal.    . potassium chloride (K-DUR) 10 MEQ tablet Take 1 tablet by mouth daily.     No current facility-administered medications for this visit.     PAST MEDICAL HISTORY: Past Medical History:  Diagnosis Date  . Anxiety and depression   . Atrial fibrillation ([place])   . CLL (chronic lymphocytic leukemia) (Joanna Herrera)   . Congestive heart failure (CHF) (Joanna Herrera)   . Dyslipidemia   . GERD (gastroesophageal reflux disease)   . Hypertension   .  Hypothyroidism   . Memory loss   . Nonischemic cardiomyopathy (Joanna Herrera)   . Varicose vein of leg     PAST SURGICAL HISTORY: Past Surgical History:  Procedure Laterality Date  . ABDOMINAL HYSTERECTOMY    . CARDIAC ELECTROPHYSIOLOGY STUDY AND ABLATION    . CHOLECYSTECTOMY    . Left heart catheterization    . PACEMAKER IMPLANT    . Replacement of dual chamber pulse generator      FAMILY HISTORY: Family History  Problem Relation Age of Onset  . Heart disease Mother   . Diabetes Mother   . Diabetes Father   . Liver cancer Sister     SOCIAL HISTORY:  Social  History   Social History  . Marital status: Legally Separated    Spouse name: N/A  . Number of children: 4  . Years of education: GED   Occupational History  . Retired    Social History Main Topics  . Smoking status: Never Smoker  . Smokeless tobacco: Never Used  . Alcohol use No  . Drug use: No  . Sexual activity: Not on file   Other Topics Concern  . Not on file   Social History Narrative   Lives at home alone.   Right-handed.   Drinks decaf coffee.     PHYSICAL EXAM   Vitals:   08/13/17 1117  BP: (!) 149/83  Pulse: 90  Weight: 159 lb 12 oz (72.5 kg)  Height: 5\' 4"  (1.626 m)    Not recorded      Body mass index is 27.42 kg/m.  PHYSICAL EXAMNIATION:  Gen: NAD, conversant, well nourised, obese, well groomed                     Cardiovascular: Regular rate rhythm, no peripheral edema, warm, nontender. Eyes: Conjunctivae clear without exudates or hemorrhage Neck: Supple, no carotid bruits. Pulmonary: Clear to auscultation bilaterally   NEUROLOGICAL EXAM:  MMSE - Mini Mental State Exam 08/13/2017  Orientation to time 3  Orientation to Place 5  Registration 3  Attention/ Calculation 5  Recall 0  Language- name 2 objects 2  Language- repeat 1  Language- follow 3 step command 2  Language- read & follow direction 1  Write a sentence 1  Copy design 1  Total score 24  animal naming 9.   CRANIAL NERVES: CN II: Visual fields are full to confrontation. Fundoscopic exam is normal with sharp discs and no vascular changes. Pupils are round equal and briskly reactive to light. CN III, IV, VI: extraocular movement are normal. No ptosis. CN V: Facial sensation is intact to pinprick in all 3 divisions bilaterally. Corneal responses are intact.  CN VII: Face is symmetric with normal eye closure and smile. CN VIII: Hearing is normal to rubbing fingers CN IX, X: Palate elevates symmetrically. Phonation is normal. CN XI: Head turning and shoulder shrug are  intact CN XII: Tongue is midline with normal movements and no atrophy.  MOTOR: There is no pronator drift of out-stretched arms. Muscle bulk and tone are normal. Muscle strength is normal.  REFLEXES: Reflexes are 2+ and symmetric at the biceps, triceps, knees, and ankles. Plantar responses are flexor.  SENSORY: Intact to light touch, pinprick, positional sensation and vibratory sensation are intact in fingers and toes.  COORDINATION: Rapid alternating movements and fine finger movements are intact. There is no dysmetria on finger-to-nose and heel-knee-shin.    GAIT/STANCE: Posture is normal. Gait is steady with normal steps, base, arm  swing, and turning. Heel and toe walking are normal. Tandem gait is normal.  Romberg is absent.   DIAGNOSTIC DATA (LABS, IMAGING, TESTING) - I reviewed patient records, labs, notes, testing and imaging myself where available.   ASSESSMENT AND PLAN  Raquel Racey is a 83 y.o. female   Mild cognitive impairment  CT head of the brain showed generalized atrophy  Most consistent with central nervous system degenerative disorder  Check B12 level  Started Namenda 10 mg twice a day  Chronic low back pain, polypharmacy treatment  Continue tapering down gabapentin  May consider increase Cymbalta if she has recurrent low back pain    Marcial Pacas, M.D. Ph.D.  [facility] [address], [place], [place] [address] Ph: [phone] Fax: [phone]  CC: Street, Joanna Mt, MD

## 2017-08-14 LAB — VITAMIN B12: VITAMIN B 12: 1010 pg/mL (ref 232–1245)

## 2017-08-20 DIAGNOSIS — Z23 Encounter for immunization: Secondary | ICD-10-CM | POA: Diagnosis not present

## 2017-08-20 DIAGNOSIS — I429 Cardiomyopathy, unspecified: Secondary | ICD-10-CM | POA: Diagnosis not present

## 2017-08-20 DIAGNOSIS — E039 Hypothyroidism, unspecified: Secondary | ICD-10-CM | POA: Diagnosis not present

## 2017-08-20 DIAGNOSIS — Z79899 Other long term (current) drug therapy: Secondary | ICD-10-CM | POA: Diagnosis not present

## 2017-08-20 DIAGNOSIS — E785 Hyperlipidemia, unspecified: Secondary | ICD-10-CM | POA: Diagnosis not present

## 2017-08-20 DIAGNOSIS — R4189 Other symptoms and signs involving cognitive functions and awareness: Secondary | ICD-10-CM | POA: Diagnosis not present

## 2017-08-20 DIAGNOSIS — I509 Heart failure, unspecified: Secondary | ICD-10-CM | POA: Diagnosis not present

## 2017-08-27 DIAGNOSIS — C9210 Chronic myeloid leukemia, BCR/ABL-positive, not having achieved remission: Secondary | ICD-10-CM | POA: Diagnosis not present

## 2017-08-27 DIAGNOSIS — D72829 Elevated white blood cell count, unspecified: Secondary | ICD-10-CM | POA: Diagnosis not present

## 2017-08-27 DIAGNOSIS — R7989 Other specified abnormal findings of blood chemistry: Secondary | ICD-10-CM | POA: Diagnosis not present

## 2017-09-29 DIAGNOSIS — C9210 Chronic myeloid leukemia, BCR/ABL-positive, not having achieved remission: Secondary | ICD-10-CM | POA: Diagnosis not present

## 2017-09-29 DIAGNOSIS — Z515 Encounter for palliative care: Secondary | ICD-10-CM | POA: Diagnosis not present

## 2017-10-29 DIAGNOSIS — C9210 Chronic myeloid leukemia, BCR/ABL-positive, not having achieved remission: Secondary | ICD-10-CM | POA: Diagnosis not present

## 2017-11-12 ENCOUNTER — Encounter (INDEPENDENT_AMBULATORY_CARE_PROVIDER_SITE_OTHER): Payer: Self-pay

## 2017-11-12 ENCOUNTER — Encounter: Payer: Self-pay | Admitting: Neurology

## 2017-11-12 ENCOUNTER — Ambulatory Visit (INDEPENDENT_AMBULATORY_CARE_PROVIDER_SITE_OTHER): Payer: Medicare Other | Admitting: Neurology

## 2017-11-12 VITALS — BP 144/81 | HR 89 | Ht 64.0 in | Wt 161.0 lb

## 2017-11-12 DIAGNOSIS — G3184 Mild cognitive impairment, so stated: Secondary | ICD-10-CM | POA: Diagnosis not present

## 2017-11-12 DIAGNOSIS — M545 Low back pain: Secondary | ICD-10-CM

## 2017-11-12 MED ORDER — MEMANTINE HCL 10 MG PO TABS: 10.0000 mg | ORAL_TABLET | Freq: Two times a day (BID) | ORAL | 4 refills | Status: DC

## 2017-11-12 MED ORDER — DONEPEZIL HCL 10 MG PO TABS: 10.0000 mg | ORAL_TABLET | Freq: Every day | ORAL | 11 refills | Status: DC

## 2017-11-12 NOTE — Progress Notes (Signed)
PATIENT: Joanna Herrera DOB: 1930-08-24  Chief Complaint  Patient presents with  . Mild Cognitive Impairment    MMSE 25/30 - 9 animals.  She is here with her son, [name] and daugther-in-law,, Gwen.  She is doing well on Namenda and memory is stable.  . Back Pain    She has stopped baclofen and reduced her gabapentin to 100mg , BID.  Her back pain is still controlled on the lower dose.  These medication changes have also helped her memory.     HISTORICAL  Joanna Herrera is 83 years old female accompanied by her son [name] and daughter-in-law [name], seen in refer by his primary care Street, Harrell Gave for evaluation of memory loss, initial evaluation was on August 13 2017,  She had past medical history of hypothyroidism, on supplement, hypertension,  history of atrial fibrillation,Status post ablation, also had pacemaker,  She lives alone, still drives short distances, had chronic low back pain, for a while she was taking baclofen up to 80 mg daily, it was stopped in July 2018, also gabapentin 100 mg 4 times a day,  She suffered a urinary tract infection in February 2018, was noted to have increased confusion, she presented to [facility] on June 22 2017 after found by her family that she was confused, could not recognize her family members, there was no evidence of active infection this time,  She retired from Actuary job, 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  She is with her son and  daughter in law at visit.   Hiram Gash was stopped since Oct 2018 due to gum and GI bleeding, she has CLL, is taking Sprycel 40mg  daily     REVIEW OF SYSTEMS: Full 14 system review of systems performed and notable only for memory loss, confusion  ALLERGIES: No Known Allergies  HOME MEDICATIONS: Current Outpatient Medications  Medication Sig Dispense Refill  . acetaminophen (TYLENOL) 500 MG tablet Take 1,000 mg by mouth.    Marland Kitchen aspirin 81 MG tablet Take 81 mg by mouth daily.    . Dasatinib (SPRYCEL PO) Take 40 mg by mouth daily.    . DULoxetine (CYMBALTA) 30 MG capsule Take 30 mg by mouth daily.     . furosemide (LASIX) 20 MG tablet Take 20 mg by mouth.    . gabapentin (NEURONTIN) 100 MG capsule Take 100 mg by mouth 2 (two) times daily.     . IRON PO Take 250 mg by mouth daily.    Marland Kitchen levothyroxine (SYNTHROID, LEVOTHROID) 75 MCG tablet Take by mouth.    Marland Kitchen lisinopril (PRINIVIL,ZESTRIL) 2.5 MG tablet Take 1 tablet by mouth daily.    . memantine (NAMENDA) 10 MG tablet Take 1 tablet (10 mg total) by mouth 2 (two) times daily. 60 tablet 11  . metoprolol succinate (TOPROL-XL) 50 MG 24 hr tablet Take 50 mg by mouth daily. Take with or immediately following a meal.    . potassium chloride (K-DUR) 10 MEQ tablet Take 1 tablet by mouth daily.     No  current facility-administered medications for this visit.     PAST MEDICAL HISTORY: Past Medical History:  Diagnosis Date  . Anxiety and depression   . Atrial fibrillation ([place])   . CLL (chronic lymphocytic leukemia) ([place])   . Congestive heart failure (CHF) ([place])   . Dyslipidemia   . GERD (gastroesophageal reflux disease)   . Hypertension   . Hypothyroidism   . Memory loss   . Nonischemic cardiomyopathy ([place])   . Varicose vein of leg     PAST SURGICAL HISTORY: Past Surgical History:  Procedure Laterality Date  . ABDOMINAL HYSTERECTOMY    . CARDIAC ELECTROPHYSIOLOGY STUDY AND ABLATION    . CHOLECYSTECTOMY    . Left heart catheterization      . PACEMAKER IMPLANT    . Replacement of dual chamber pulse generator      FAMILY HISTORY: Family History  Problem Relation Age of Onset  . Heart disease Mother   . Diabetes Mother   . Diabetes Father   . Liver cancer Sister     SOCIAL HISTORY:  Social History   Socioeconomic History  . Marital status: Legally Separated    Spouse name: Not on file  . Number of children: 4  . Years of education: GED  . Highest education level: Not on file  Social Needs  . Financial resource strain: Not on file  . Food insecurity - worry: Not on file  . Food insecurity - inability: Not on file  . Transportation needs - medical: Not on file  . Transportation needs - non-medical: Not on file  Occupational History  . Occupation: Retired  Tobacco Use  . Smoking status: Never Smoker  . Smokeless tobacco: Never Used  Substance and Sexual Activity  . Alcohol use: No  . Drug use: No  . Sexual activity: Not on file  Other Topics Concern  . Not on file  Social History Narrative   Lives at home alone.   Right-handed.   Drinks decaf coffee.     PHYSICAL EXAM   Vitals:   11/12/17 0905  BP: (!) 144/81  Pulse: 89  Weight: 161 lb (73 kg)  Height: 5\' 4"  (1.626 m)    Not recorded      Body mass index is 27.64 kg/m.  PHYSICAL EXAMNIATION:  Gen: NAD, conversant, well nourised, obese, well groomed                     Cardiovascular: Regular rate rhythm, no peripheral edema, warm, nontender. Eyes: Conjunctivae clear without exudates or hemorrhage Neck: Supple, no carotid bruits. Pulmonary: Clear to auscultation bilaterally   NEUROLOGICAL EXAM:  MMSE - Mini Mental State Exam 11/12/2017 08/13/2017  Orientation to time 3 3  Orientation to Place 5 5  Registration 3 3  Attention/ Calculation 5 5  Recall 0 0  Language- name 2 objects 2 2  Language- repeat 1 1  Language- follow 3 step command 3 2  Language- read & follow direction 1 1  Write a sentence 1 1  Copy design 1 1  Total  score 25 24  animal naming 9.   CRANIAL NERVES: CN II: Visual fields are full to confrontation. Fundoscopic exam is normal with sharp discs and no vascular changes. Pupils are round equal and briskly reactive to light. CN III, IV, VI: extraocular movement are normal. No ptosis. CN V: Facial sensation is intact to pinprick in all 3 divisions bilaterally. Corneal responses are intact.  CN VII: Face is symmetric with  normal eye closure and smile. CN VIII: Hearing is normal to rubbing fingers CN IX, X: Palate elevates symmetrically. Phonation is normal. CN XI: Head turning and shoulder shrug are intact CN XII: Tongue is midline with normal movements and no atrophy.  MOTOR: There is no pronator drift of out-stretched arms. Muscle bulk and tone are normal. Muscle strength is normal.  REFLEXES: Reflexes are 2+ and symmetric at the biceps, triceps, knees, and ankles. Plantar responses are flexor.  SENSORY: Intact to light touch, pinprick, positional sensation and vibratory sensation are intact in fingers and toes.  COORDINATION: Rapid alternating movements and fine finger movements are intact. There is no dysmetria on finger-to-nose and heel-knee-shin.    GAIT/STANCE: Posture is normal.   DIAGNOSTIC DATA (LABS, IMAGING, TESTING) - I reviewed patient records, labs, notes, testing and imaging myself where available.   ASSESSMENT AND PLAN  Corleen Otwell is a 83 y.o. female   Mild cognitive impairment  CT head of the brain showed generalized atrophy  Most consistent with central nervous system degenerative disorder  No treatable cause found    Keep Namenda 10 mg twice a day  Add on Aricept 10mg  daiily  Chronic low back pain, polypharmacy treatment  Continue tapering down gabapentin   Keep Cymbalta 30mg  daily  Low dose gabapentin 100mg  bid   Marcial Pacas, M.D. Ph.D.  [facility] [address] [place], [place] [address] Ph: [phone] Fax:  [phone]  CC: Street, Sharon Mt, MD

## 2017-11-13 DIAGNOSIS — Z950 Presence of cardiac pacemaker: Secondary | ICD-10-CM | POA: Diagnosis not present

## 2017-11-13 DIAGNOSIS — E039 Hypothyroidism, unspecified: Secondary | ICD-10-CM | POA: Diagnosis not present

## 2017-11-13 DIAGNOSIS — I482 Chronic atrial fibrillation: Secondary | ICD-10-CM | POA: Diagnosis not present

## 2017-11-13 DIAGNOSIS — F0390 Unspecified dementia without behavioral disturbance: Secondary | ICD-10-CM | POA: Diagnosis not present

## 2017-11-13 DIAGNOSIS — I10 Essential (primary) hypertension: Secondary | ICD-10-CM | POA: Diagnosis not present

## 2017-11-27 DIAGNOSIS — Z950 Presence of cardiac pacemaker: Secondary | ICD-10-CM | POA: Diagnosis not present

## 2017-12-01 DIAGNOSIS — C9210 Chronic myeloid leukemia, BCR/ABL-positive, not having achieved remission: Secondary | ICD-10-CM | POA: Diagnosis not present

## 2017-12-01 DIAGNOSIS — Z79899 Other long term (current) drug therapy: Secondary | ICD-10-CM | POA: Diagnosis not present

## 2018-01-20 DIAGNOSIS — J40 Bronchitis, not specified as acute or chronic: Secondary | ICD-10-CM | POA: Diagnosis not present

## 2018-01-20 DIAGNOSIS — J329 Chronic sinusitis, unspecified: Secondary | ICD-10-CM | POA: Diagnosis not present

## 2018-01-20 DIAGNOSIS — Z6827 Body mass index (BMI) 27.0-27.9, adult: Secondary | ICD-10-CM | POA: Diagnosis not present

## 2018-02-23 DIAGNOSIS — C9210 Chronic myeloid leukemia, BCR/ABL-positive, not having achieved remission: Secondary | ICD-10-CM | POA: Diagnosis not present

## 2018-02-23 DIAGNOSIS — D509 Iron deficiency anemia, unspecified: Secondary | ICD-10-CM | POA: Diagnosis not present

## 2018-02-25 DIAGNOSIS — Z950 Presence of cardiac pacemaker: Secondary | ICD-10-CM | POA: Diagnosis not present

## 2018-03-03 DIAGNOSIS — Z6827 Body mass index (BMI) 27.0-27.9, adult: Secondary | ICD-10-CM | POA: Diagnosis not present

## 2018-03-03 DIAGNOSIS — J302 Other seasonal allergic rhinitis: Secondary | ICD-10-CM | POA: Diagnosis not present

## 2018-03-04 DIAGNOSIS — C9210 Chronic myeloid leukemia, BCR/ABL-positive, not having achieved remission: Secondary | ICD-10-CM | POA: Diagnosis not present

## 2018-03-06 DIAGNOSIS — C9210 Chronic myeloid leukemia, BCR/ABL-positive, not having achieved remission: Secondary | ICD-10-CM | POA: Diagnosis not present

## 2018-03-06 DIAGNOSIS — Z79899 Other long term (current) drug therapy: Secondary | ICD-10-CM | POA: Diagnosis not present

## 2018-04-09 DIAGNOSIS — Z9181 History of falling: Secondary | ICD-10-CM | POA: Diagnosis not present

## 2018-04-09 DIAGNOSIS — R410 Disorientation, unspecified: Secondary | ICD-10-CM | POA: Diagnosis not present

## 2018-04-09 DIAGNOSIS — R109 Unspecified abdominal pain: Secondary | ICD-10-CM | POA: Diagnosis not present

## 2018-04-09 DIAGNOSIS — Z6825 Body mass index (BMI) 25.0-25.9, adult: Secondary | ICD-10-CM | POA: Diagnosis not present

## 2018-05-04 DIAGNOSIS — C9210 Chronic myeloid leukemia, BCR/ABL-positive, not having achieved remission: Secondary | ICD-10-CM | POA: Diagnosis not present

## 2018-06-04 DIAGNOSIS — Z95810 Presence of automatic (implantable) cardiac defibrillator: Secondary | ICD-10-CM | POA: Diagnosis not present

## 2018-06-04 DIAGNOSIS — Z4502 Encounter for adjustment and management of automatic implantable cardiac defibrillator: Secondary | ICD-10-CM | POA: Diagnosis not present

## 2018-06-05 DIAGNOSIS — C9210 Chronic myeloid leukemia, BCR/ABL-positive, not having achieved remission: Secondary | ICD-10-CM | POA: Diagnosis not present

## 2018-06-12 DIAGNOSIS — Z79899 Other long term (current) drug therapy: Secondary | ICD-10-CM | POA: Diagnosis not present

## 2018-06-12 DIAGNOSIS — C9210 Chronic myeloid leukemia, BCR/ABL-positive, not having achieved remission: Secondary | ICD-10-CM | POA: Diagnosis not present

## 2018-06-12 DIAGNOSIS — R5383 Other fatigue: Secondary | ICD-10-CM | POA: Diagnosis not present

## 2018-07-01 DIAGNOSIS — L255 Unspecified contact dermatitis due to plants, except food: Secondary | ICD-10-CM | POA: Diagnosis not present

## 2018-07-01 DIAGNOSIS — Z6825 Body mass index (BMI) 25.0-25.9, adult: Secondary | ICD-10-CM | POA: Diagnosis not present

## 2018-07-16 DIAGNOSIS — L309 Dermatitis, unspecified: Secondary | ICD-10-CM | POA: Diagnosis not present

## 2018-07-17 DIAGNOSIS — Z9889 Other specified postprocedural states: Secondary | ICD-10-CM | POA: Diagnosis not present

## 2018-07-17 DIAGNOSIS — F0150 Vascular dementia without behavioral disturbance: Secondary | ICD-10-CM | POA: Diagnosis not present

## 2018-07-17 DIAGNOSIS — E039 Hypothyroidism, unspecified: Secondary | ICD-10-CM | POA: Diagnosis not present

## 2018-07-17 DIAGNOSIS — Z950 Presence of cardiac pacemaker: Secondary | ICD-10-CM | POA: Diagnosis not present

## 2018-07-17 DIAGNOSIS — I482 Chronic atrial fibrillation: Secondary | ICD-10-CM | POA: Diagnosis not present

## 2018-09-03 DIAGNOSIS — Z950 Presence of cardiac pacemaker: Secondary | ICD-10-CM | POA: Diagnosis not present

## 2018-10-05 ENCOUNTER — Ambulatory Visit: Payer: Medicare Other | Admitting: Neurology

## 2018-10-06 DIAGNOSIS — C9210 Chronic myeloid leukemia, BCR/ABL-positive, not having achieved remission: Secondary | ICD-10-CM | POA: Diagnosis not present

## 2018-10-13 DIAGNOSIS — Z79899 Other long term (current) drug therapy: Secondary | ICD-10-CM

## 2018-10-13 DIAGNOSIS — C9210 Chronic myeloid leukemia, BCR/ABL-positive, not having achieved remission: Secondary | ICD-10-CM

## 2018-10-15 DIAGNOSIS — C9210 Chronic myeloid leukemia, BCR/ABL-positive, not having achieved remission: Secondary | ICD-10-CM | POA: Diagnosis not present

## 2018-11-12 ENCOUNTER — Ambulatory Visit: Payer: Medicare Other | Admitting: Neurology

## 2018-11-23 ENCOUNTER — Other Ambulatory Visit: Payer: Self-pay | Admitting: *Deleted

## 2018-11-23 MED ORDER — DONEPEZIL HCL 10 MG PO TABS: 10.0000 mg | ORAL_TABLET | Freq: Every day | ORAL | 0 refills | Status: DC

## 2018-11-23 MED ORDER — MEMANTINE HCL 10 MG PO TABS: 10.0000 mg | ORAL_TABLET | Freq: Two times a day (BID) | ORAL | 0 refills | Status: DC

## 2018-11-24 DIAGNOSIS — J40 Bronchitis, not specified as acute or chronic: Secondary | ICD-10-CM | POA: Diagnosis not present

## 2018-11-24 DIAGNOSIS — H1132 Conjunctival hemorrhage, left eye: Secondary | ICD-10-CM | POA: Diagnosis not present

## 2018-11-24 DIAGNOSIS — J9811 Atelectasis: Secondary | ICD-10-CM | POA: Diagnosis not present

## 2018-11-24 DIAGNOSIS — J90 Pleural effusion, not elsewhere classified: Secondary | ICD-10-CM | POA: Diagnosis not present

## 2018-11-24 DIAGNOSIS — R05 Cough: Secondary | ICD-10-CM | POA: Diagnosis not present

## 2018-12-02 DIAGNOSIS — H9193 Unspecified hearing loss, bilateral: Secondary | ICD-10-CM | POA: Diagnosis not present

## 2018-12-02 DIAGNOSIS — H6693 Otitis media, unspecified, bilateral: Secondary | ICD-10-CM | POA: Diagnosis not present

## 2018-12-02 DIAGNOSIS — Z6825 Body mass index (BMI) 25.0-25.9, adult: Secondary | ICD-10-CM | POA: Diagnosis not present

## 2018-12-02 DIAGNOSIS — H6121 Impacted cerumen, right ear: Secondary | ICD-10-CM | POA: Diagnosis not present

## 2018-12-03 DIAGNOSIS — Z950 Presence of cardiac pacemaker: Secondary | ICD-10-CM | POA: Diagnosis not present

## 2018-12-14 DIAGNOSIS — H6590 Unspecified nonsuppurative otitis media, unspecified ear: Secondary | ICD-10-CM | POA: Diagnosis not present

## 2018-12-14 DIAGNOSIS — H938X3 Other specified disorders of ear, bilateral: Secondary | ICD-10-CM | POA: Diagnosis not present

## 2018-12-14 DIAGNOSIS — J342 Deviated nasal septum: Secondary | ICD-10-CM | POA: Diagnosis not present

## 2018-12-14 DIAGNOSIS — J069 Acute upper respiratory infection, unspecified: Secondary | ICD-10-CM | POA: Diagnosis not present

## 2018-12-14 DIAGNOSIS — H6983 Other specified disorders of Eustachian tube, bilateral: Secondary | ICD-10-CM | POA: Diagnosis not present

## 2018-12-14 DIAGNOSIS — S00411A Abrasion of right ear, initial encounter: Secondary | ICD-10-CM | POA: Diagnosis not present

## 2018-12-22 ENCOUNTER — Telehealth: Payer: Self-pay | Admitting: Neurology

## 2018-12-22 ENCOUNTER — Encounter: Payer: Self-pay | Admitting: Neurology

## 2018-12-22 ENCOUNTER — Ambulatory Visit (INDEPENDENT_AMBULATORY_CARE_PROVIDER_SITE_OTHER): Payer: Medicare Other | Admitting: Neurology

## 2018-12-22 VITALS — BP 128/78 | HR 76 | Ht 64.0 in | Wt 151.5 lb

## 2018-12-22 DIAGNOSIS — G3184 Mild cognitive impairment, so stated: Secondary | ICD-10-CM | POA: Diagnosis not present

## 2018-12-22 MED ORDER — MEMANTINE HCL 10 MG PO TABS: 10.0000 mg | ORAL_TABLET | Freq: Two times a day (BID) | ORAL | 4 refills | Status: AC

## 2018-12-22 MED ORDER — DONEPEZIL HCL 10 MG PO TABS: 10.0000 mg | ORAL_TABLET | Freq: Every day | ORAL | 4 refills | Status: AC

## 2018-12-22 NOTE — Telephone Encounter (Signed)
error 

## 2018-12-22 NOTE — Progress Notes (Signed)
PATIENT: Joanna Herrera DOB: [birth year]  REASON FOR VISIT: follow up HISTORY FROM: patient  HISTORY OF PRESENT ILLNESS: Today 12/22/18  Joanna Herrera is an 85 year old female presenting for follow-up today for her memory, accompanied with her son, and daughter-in-law.   She has had a cold recently and her ears have been stopped up. She has seen ENT. She is using drops for her ears and nasal spray.   She lives at home alone, but her son comes and stays with her in the evenings. She is still driving, but not much. She denies any falls, and doesn't have any need to utilize assistive devices. She gets help with bills. She is able to do some cooking and housework. She does make breakfast, bakes cakes, and pintos. She still works in her yard, she loves to work with her flowers. She grows tomatoes, and is very good at it. Month and date are hard for her to remember. She has a clock on her mantle to help her remember. Her washer and dryer are in the basement, so she has to walk around outside to the basement.  She is tolerating Namenda and Aricept without problem.    Her back ache is better. She hasn't noticed it any pain, and hasn't complained to her family about it. She presents today for follow-up.   HISTORY  Joanna Herrera is 85 years old female accompanied by her son Joanna Herrera and daughter-in-law Joanna Herrera, seen in refer by his primary care Street, Harrell Gave for evaluation of memory loss, initial evaluation was on August 13 2017,  She had past medical history of hypothyroidism, on supplement, hypertension,  history of atrial fibrillation,Status post ablation, also had pacemaker,  She lives alone, still drives short distances, had chronic low back pain, for a while she was taking baclofen up to 80 mg daily, it was stopped in July 2018, also gabapentin 100 mg 4 times a day,  She suffered a urinary tract infection in February 2018, was noted to have increased confusion, she presented to [facility] on June 22 2017 after found by her family that she was confused, could not recognize her family members, there was no evidence of active infection this time,  She retired from Actuary job, 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  She is with her son and daughter in law at visit.   Joanna Herrera was stopped since Oct 2018 due to gum and GI bleeding, she has CLL, is taking Sprycel 40mg  daily   REVIEW OF SYSTEMS: Out of a complete 14 system review of symptoms, the patient complains only of the following symptoms, and all other reviewed systems are negative.  ALLERGIES: No Known Allergies  HOME MEDICATIONS: Outpatient Medications Prior to Visit  Medication Sig Dispense Refill  . acetaminophen (TYLENOL) 500 MG tablet Take 1,000 mg by mouth.    Marland Kitchen aspirin 81 MG tablet Take 81 mg by mouth daily.    . Dasatinib (SPRYCEL PO) Take 40 mg by mouth daily.    Marland Kitchen donepezil (ARICEPT) 10 MG  tablet Take 1 tablet (10 mg total) by mouth at bedtime. 90 tablet 0  . DULoxetine (CYMBALTA) 30 MG capsule Take 30 mg by mouth daily.     . furosemide (LASIX) 20 MG tablet Take 20 mg by mouth.    . gabapentin (NEURONTIN) 100 MG capsule Take 100 mg by mouth daily.     . IRON PO Take 250 mg by mouth daily.    Marland Kitchen levothyroxine (SYNTHROID, LEVOTHROID) 75 MCG tablet Take by mouth.    Marland Kitchen lisinopril (PRINIVIL,ZESTRIL) 2.5 MG tablet Take 1 tablet by mouth daily.    . memantine (NAMENDA) 10 MG tablet Take 1 tablet (10 mg total) by  mouth 2 (two) times daily. 180 tablet 0  . metoprolol succinate (TOPROL-XL) 25 MG 24 hr tablet Take 25 mg by mouth daily.    . potassium chloride (K-DUR) 10 MEQ tablet Take 1 tablet by mouth daily.    . metoprolol succinate (TOPROL-XL) 50 MG 24 hr tablet Take 50 mg by mouth daily. Take with or immediately following a meal.     No facility-administered medications prior to visit.     PAST MEDICAL HISTORY: Past Medical History:  Diagnosis Date  . Anxiety and depression   . Atrial fibrillation ([place])   . CLL (chronic lymphocytic leukemia) ([place])   . Congestive heart failure (CHF) ([place])   . Dyslipidemia   . GERD (gastroesophageal reflux disease)   . Hypertension   . Hypothyroidism   . Memory loss   . Nonischemic cardiomyopathy ([place])   . Varicose vein of leg     PAST SURGICAL HISTORY: Past Surgical History:  Procedure Laterality Date  . ABDOMINAL HYSTERECTOMY    . CARDIAC ELECTROPHYSIOLOGY STUDY AND ABLATION    . CHOLECYSTECTOMY    . Left heart catheterization    . PACEMAKER IMPLANT    . Replacement of dual chamber pulse generator      FAMILY HISTORY: Family History  Problem Relation Age of Onset  . Heart disease Mother   . Diabetes Mother   . Diabetes Father   . Liver cancer Sister     SOCIAL HISTORY: Social History   Socioeconomic History  . Marital status: Legally Separated    Spouse name: Not on file  . Number of children: 4  . Years of education: GED  . Highest education level: Not on file  Occupational History  . Occupation: Retired  Scientific laboratory technician  . Financial resource strain: Not on file  . Food insecurity:    Worry: Not on file    Inability: Not on file  . Transportation needs:    Medical: Not on file    Non-medical: Not on file  Tobacco Use  . Smoking status: Never Smoker  . Smokeless tobacco: Never Used  Substance and Sexual Activity  . Alcohol use: No  . Drug use: No  . Sexual activity: Not on file  Lifestyle  . Physical activity:    Days per  week: Not on file    Minutes per session: Not on file  . Stress: Not on file  Relationships  . Social connections:    Talks on phone: Not on file    Gets together: Not on file    Attends religious service: Not on file    Active member of club or organization: Not on file    Attends meetings of clubs or organizations: Not on file    Relationship status: Not on file  . Intimate partner violence:    Fear of  current or ex partner: Not on file    Emotionally abused: Not on file    Physically abused: Not on file    Forced sexual activity: Not on file  Other Topics Concern  . Not on file  Social History Narrative   Lives at home alone.   Right-handed.   Drinks decaf coffee.      PHYSICAL EXAM  Vitals:   12/22/18 0936  Weight: 151 lb 8 oz (68.7 kg)  Height: 5\' 4"  (1.626 m)   Body mass index is 26 kg/m.  Generalized: Well developed, in no acute distress   Neurological examination  Mentation: Alert oriented to time, place, history taking. Follows all commands speech and language fluent Cranial nerve II-XII: Pupils were equal round reactive to light. Extraocular movements were full, visual field were full on confrontational test. Facial sensation and strength were normal. Uvula tongue midline. Head turning and shoulder shrug  were normal and symmetric. Motor: The motor testing reveals 5 over 5 strength of all 4 extremities. Good symmetric motor tone is noted throughout.  Sensory: Sensory testing is intact to soft touch on all 4 extremities. No evidence of extinction is noted.  Coordination: Cerebellar testing reveals good finger-nose-finger and heel-to-shin bilaterally.  Gait and station: Gait is normal. Tandem gait is normal. Romberg is negative. No drift is seen.  Reflexes: Deep tendon reflexes are symmetric and normal bilaterally.   DIAGNOSTIC DATA (LABS, IMAGING, TESTING) - I reviewed patient records, labs, notes, testing and imaging myself where available.  No results  found for: WBC, HGB, HCT, MCV, PLT No results found for: NA, K, CL, CO2, GLUCOSE, BUN, CREATININE, CALCIUM, PROT, ALBUMIN, AST, ALT, ALKPHOS, BILITOT, GFRNONAA, GFRAA No results found for: CHOL, HDL, LDLCALC, LDLDIRECT, TRIG, CHOLHDL No results found for: HGBA1C Lab Results  Component Value Date   VITAMINB12 1,010 08/13/2017   No results found for: TSH   ASSESSMENT AND PLAN 85 y.o. year old female   Mild cognitive impairment  Her memory appears stable. MMSE 25/30 (December 2018 25/30), 11 animals. A refill was sent in for Aricept and Namenda. She has a good support system with family involvement. We discussed the best way to promote brain health is to live right. Eat healthy, exercise, and have good sleep habits. She will call our office with any concerns and she will follow-up on an as needed basis.   I spent 15 minutes with the patient. 50% of this time was spent discussing her plan of care.   I interview and exam patient with NP Rosetta Posner, AGNP-C, DNP 12/22/2018, 9:42 AM [facility] [address] [place], [place] [address] [phone]

## 2018-12-29 DIAGNOSIS — I4891 Unspecified atrial fibrillation: Secondary | ICD-10-CM | POA: Diagnosis not present

## 2018-12-29 DIAGNOSIS — Z950 Presence of cardiac pacemaker: Secondary | ICD-10-CM | POA: Diagnosis not present

## 2019-01-16 DIAGNOSIS — S81802A Unspecified open wound, left lower leg, initial encounter: Secondary | ICD-10-CM | POA: Diagnosis not present

## 2019-01-19 DIAGNOSIS — S81802S Unspecified open wound, left lower leg, sequela: Secondary | ICD-10-CM | POA: Diagnosis not present

## 2019-02-04 DIAGNOSIS — C9210 Chronic myeloid leukemia, BCR/ABL-positive, not having achieved remission: Secondary | ICD-10-CM | POA: Diagnosis not present

## 2019-02-05 DIAGNOSIS — C9210 Chronic myeloid leukemia, BCR/ABL-positive, not having achieved remission: Secondary | ICD-10-CM | POA: Diagnosis not present

## 2019-02-11 DIAGNOSIS — C9210 Chronic myeloid leukemia, BCR/ABL-positive, not having achieved remission: Secondary | ICD-10-CM | POA: Diagnosis not present

## 2019-02-12 DIAGNOSIS — H6121 Impacted cerumen, right ear: Secondary | ICD-10-CM | POA: Diagnosis not present

## 2019-02-12 DIAGNOSIS — E039 Hypothyroidism, unspecified: Secondary | ICD-10-CM | POA: Diagnosis not present

## 2019-02-12 DIAGNOSIS — M545 Low back pain: Secondary | ICD-10-CM | POA: Diagnosis not present

## 2019-02-12 DIAGNOSIS — F0150 Vascular dementia without behavioral disturbance: Secondary | ICD-10-CM | POA: Diagnosis not present

## 2019-02-12 DIAGNOSIS — I10 Essential (primary) hypertension: Secondary | ICD-10-CM | POA: Diagnosis not present

## 2019-02-15 DIAGNOSIS — Z8669 Personal history of other diseases of the nervous system and sense organs: Secondary | ICD-10-CM | POA: Diagnosis not present

## 2019-02-15 DIAGNOSIS — H6121 Impacted cerumen, right ear: Secondary | ICD-10-CM | POA: Diagnosis not present

## 2019-02-15 DIAGNOSIS — H938X2 Other specified disorders of left ear: Secondary | ICD-10-CM | POA: Diagnosis not present

## 2019-02-15 DIAGNOSIS — J342 Deviated nasal septum: Secondary | ICD-10-CM | POA: Diagnosis not present

## 2019-03-04 DIAGNOSIS — Z950 Presence of cardiac pacemaker: Secondary | ICD-10-CM | POA: Diagnosis not present

## 2019-05-10 DIAGNOSIS — R8281 Pyuria: Secondary | ICD-10-CM | POA: Diagnosis not present

## 2019-05-10 DIAGNOSIS — R5381 Other malaise: Secondary | ICD-10-CM | POA: Diagnosis not present

## 2019-05-10 DIAGNOSIS — R5383 Other fatigue: Secondary | ICD-10-CM | POA: Diagnosis not present

## 2019-05-21 ENCOUNTER — Encounter: Payer: Self-pay | Admitting: Gastroenterology

## 2019-05-31 ENCOUNTER — Other Ambulatory Visit: Payer: Self-pay

## 2019-05-31 ENCOUNTER — Encounter: Payer: Self-pay | Admitting: Gastroenterology

## 2019-05-31 ENCOUNTER — Telehealth (INDEPENDENT_AMBULATORY_CARE_PROVIDER_SITE_OTHER): Payer: Medicare Other | Admitting: Gastroenterology

## 2019-05-31 VITALS — Ht 65.0 in | Wt 135.0 lb

## 2019-05-31 DIAGNOSIS — R195 Other fecal abnormalities: Secondary | ICD-10-CM

## 2019-05-31 DIAGNOSIS — R634 Abnormal weight loss: Secondary | ICD-10-CM

## 2019-05-31 NOTE — Progress Notes (Signed)
Chief Complaint: Heme positive stools  Referring Provider: Dr. Unk Lightning      ASSESSMENT AND PLAN;   #1. Heme positive stools. Hb 11.5 (05/10/2019, baseline 12.4 04/2017). Neg EGD and colon 03/2014 except for atrophic gastritis and moderate pancolonic diverticulosis.  #2. Wt Loss (20lb over 2 yrs). Nl TSH, CBC except hemoglobin 11.5, normal CMP including albumin 4.3 and Nl Cr 0.76 (05/10/2019)  #3. Comorbid problems including dementia, A.Fib, HTN, CML, pacemaker, hypothyroidism chronic back pain d/t DJD and advanced age.   Plan: - CT abdo/pel with PO/IV contrast. - FU in 4-6 weeks. - Small but more frequent meals. - Ensure 1/day. - Patient does not want to go through any invasive endoscopic procedures including EGD or repeat colonoscopy if she does not have to. -Trend CBC (can get follow-up CBC in 6 to 8 weeks at Dr. Unk Lightning office). - Discussed in detail with [name] (patient's daughter-in-law).   HPI:    Joanna Herrera is a 85 y.o. female  History is mainly from daughter-in-law [name] With generalized fatigue Found to have heme positive stools Has occasionally been taking iron tablets. Hemoglobin at 11.5 (baseline 12.4 2018).  No frank blood.  She has not been on any blood thinners.  She has been taken off Xarelto about 2 years ago.  Denies having any nausea, vomiting, heartburn, regurgitation, odynophagia or dysphagia.  Denies having any significant diarrhea or constipation.  She has progressive dementia.  No nonsteroidals.  Has lost 20 pounds over the last 2 years.  Being followed by Dr. Bobby Rumpf for [facility].  Been stable.  Has moderate to severe chronic back pain.  Had CT lumbar spine previously which showed significant DJD, spinal stenosis and scoliosis.  Labs - Nl TSH, CBC except hemoglobin 11.5, normal CMP including albumin 4.3 (05/10/2019)  Past GI procedures: -Colonoscopy 04/23/2014-fair prep, moderate pancolonic diverticulosis. -EGD 04/23/2014-atrophic gastritis,  otherwise normal EGD.  Negative SB Bx. Past Medical History:  Diagnosis Date  . Anorexia   . Anxiety   . Anxiety and depression   . Atrial fibrillation ([place])   . Chronic venous insufficiency   . CLL (chronic lymphocytic leukemia) ([place])   . Congestive heart failure (CHF) ([place])   . Depression   . Dyslipidemia   . GERD (gastroesophageal reflux disease)   . Hypertension   . Hypothyroidism   . Left kidney mass   . Memory loss   . Nonischemic cardiomyopathy ([place])   . Valvular heart disease   . Varicose vein of leg     Past Surgical History:  Procedure Laterality Date  . ABDOMINAL HYSTERECTOMY    . CARDIAC ELECTROPHYSIOLOGY STUDY AND ABLATION    . CHOLECYSTECTOMY    . COLONOSCOPY  04/23/2014   Moderate pancolonic diverticulosis, otherwise normal   . ESOPHAGOGASTRODUODENOSCOPY  04/23/2014   Astrophic gastritis, otherwise normal EGD.   Marland Kitchen Left heart catheterization    . PACEMAKER IMPLANT    . Replacement of dual chamber pulse generator      Family History  Problem Relation Age of Onset  . Heart disease Mother   . Diabetes Mother   . Diabetes Father   . Liver cancer Sister   . Colon cancer Neg Hx     Social History   Tobacco Use  . Smoking status: Never Smoker  . Smokeless tobacco: Never Used  Substance Use Topics  . Alcohol use: No  . Drug use: No    Current Outpatient Medications  Medication Sig Dispense Refill  . acetaminophen (TYLENOL) 500 MG  tablet Take 1,000 mg by mouth.    Marland Kitchen aspirin 81 MG tablet Take 81 mg by mouth daily.    . Dasatinib (SPRYCEL PO) Take 40 mg by mouth daily.    Marland Kitchen donepezil (ARICEPT) 10 MG tablet Take 1 tablet (10 mg total) by mouth at bedtime. 90 tablet 4  . DULoxetine (CYMBALTA) 30 MG capsule Take 30 mg by mouth daily.     . furosemide (LASIX) 20 MG tablet Take 20 mg by mouth.    . gabapentin (NEURONTIN) 100 MG capsule Take 100 mg by mouth daily.     . IRON PO Take 250 mg by mouth daily.    Marland Kitchen levothyroxine (SYNTHROID, LEVOTHROID) 75 MCG  tablet Take by mouth.    Marland Kitchen lisinopril (PRINIVIL,ZESTRIL) 2.5 MG tablet Take 1 tablet by mouth daily.    . memantine (NAMENDA) 10 MG tablet Take 1 tablet (10 mg total) by mouth 2 (two) times daily. 180 tablet 4  . metoprolol succinate (TOPROL-XL) 25 MG 24 hr tablet Take 25 mg by mouth daily.    . potassium chloride (K-DUR) 10 MEQ tablet Take 1 tablet by mouth daily.     No current facility-administered medications for this visit.     No Known Allergies  Review of Systems:  Has fatigue, decreased appetite Chronic back pain Leg pain Has a pacemaker-not a candidate for MRI of the back.     Physical Exam:    Ht 5\' 5"  (1.651 m)   Wt 135 lb (61.2 kg)   BMI 22.47 kg/m  Filed Weights   05/31/19 1207  Weight: 135 lb (61.2 kg)  televisit  This service was provided via telemedicine.  The patient was located at home.  The provider was located in office.  The patient did consent to this telephone visit and is aware of possible charges through their insurance for this visit.  The patient was referred by Dr. Unk Lightning.  The other persons participating in this telemedicine service were patient's daughter-in-law [name] and their role was caregiver.  Time spent on call/coordination of care/review of records: 25 min    Carmell Austria, MD 05/31/2019, 3:20 PM  Cc: Dr Unk Lightning

## 2019-05-31 NOTE — Patient Instructions (Signed)
If you are age 83 or older, your body mass index should be between 23-30. Your Body mass index is 22.47 kg/m. If this is out of the aforementioned range listed, please consider follow up with your Primary Care Provider.  If you are age 40 or younger, your body mass index should be between 19-25. Your Body mass index is 22.47 kg/m. If this is out of the aformentioned range listed, please consider follow up with your Primary Care Provider.   You have [facility] [place] [address] [facility]   You are scheduled on 06/07/19 at [facility] should arrive 15 minutes prior to your appointment time for registration. Please follow the written instructions below on the day of your exam:  WARNING: IF YOU ARE ALLERGIC TO IODINE/X-RAY DYE, PLEASE NOTIFY RADIOLOGY IMMEDIATELY AT [phone]! YOU WILL BE GIVEN A 13 HOUR PREMEDICATION PREP.  1) Do not eat or drink anything after 5am (4 hours prior to your test) 2) You have been given 2 bottles of oral contrast to drink. The solution may taste better if refrigerated, but do NOT add ice or any other liquid to this solution. Shake well before drinking.    Drink 1 bottle of contrast @ 7am (2 hours prior to your exam)  Drink 1 bottle of contrast @ 8am (1 hour prior to your exam)  You may take any medications as prescribed with a small amount of water, if necessary. If you take any of the following medications: METFORMIN, GLUCOPHAGE, GLUCOVANCE, AVANDAMET, RIOMET, FORTAMET, [place] MET, JANUMET, GLUMETZA or METAGLIP, you MAY be asked to HOLD this medication 48 hours AFTER the exam.  The purpose of you drinking the oral contrast is to aid in the visualization of your intestinal tract. The contrast solution may cause some diarrhea. Depending on your individual set of symptoms, you may also receive an intravenous injection of x-ray contrast/dye. Plan on being at [facility] for 30 minutes or longer, depending on the type of exam you are having performed.  This test typically takes 30-45 minutes to complete.  If you have any questions regarding your exam or if you need to reschedule, you may call the CT department at [phone] between the hours of 8:00 am and 5:00 pm, Monday-Friday.  ________________________________________________________________________  Drink 1 Ensure daily.   Small more frequent meals.   Follow up 4-6 weeks.  Thank you,  Dr. Jackquline Denmark

## 2019-06-03 DIAGNOSIS — Z950 Presence of cardiac pacemaker: Secondary | ICD-10-CM | POA: Diagnosis not present

## 2019-06-04 DIAGNOSIS — C9210 Chronic myeloid leukemia, BCR/ABL-positive, not having achieved remission: Secondary | ICD-10-CM | POA: Diagnosis not present

## 2019-06-07 ENCOUNTER — Other Ambulatory Visit: Payer: Self-pay

## 2019-06-07 ENCOUNTER — Ambulatory Visit (HOSPITAL_BASED_OUTPATIENT_CLINIC_OR_DEPARTMENT_OTHER)
Admission: RE | Admit: 2019-06-07 | Discharge: 2019-06-07 | Disposition: A | Discharge status: Home / Self Care | Payer: Medicare Other | Source: Ambulatory Visit | Attending: Gastroenterology | Admitting: Gastroenterology

## 2019-06-07 DIAGNOSIS — K7689 Other specified diseases of liver: Secondary | ICD-10-CM | POA: Diagnosis not present

## 2019-06-07 DIAGNOSIS — R195 Other fecal abnormalities: Secondary | ICD-10-CM | POA: Insufficient documentation

## 2019-06-07 DIAGNOSIS — R634 Abnormal weight loss: Secondary | ICD-10-CM | POA: Insufficient documentation

## 2019-06-07 MED ORDER — IOHEXOL 300 MG/ML  SOLN
100.0000 mL | Freq: Once | INTRAMUSCULAR | Status: AC | PRN
  Administered 2019-06-07: 100 mL via INTRAVENOUS

## 2019-06-11 DIAGNOSIS — M7918 Myalgia, other site: Secondary | ICD-10-CM | POA: Diagnosis not present

## 2019-06-11 DIAGNOSIS — M546 Pain in thoracic spine: Secondary | ICD-10-CM | POA: Diagnosis not present

## 2019-06-11 DIAGNOSIS — M47814 Spondylosis without myelopathy or radiculopathy, thoracic region: Secondary | ICD-10-CM | POA: Diagnosis not present

## 2019-06-11 DIAGNOSIS — M545 Low back pain: Secondary | ICD-10-CM | POA: Diagnosis not present

## 2019-06-14 DIAGNOSIS — D509 Iron deficiency anemia, unspecified: Secondary | ICD-10-CM | POA: Diagnosis not present

## 2019-06-14 DIAGNOSIS — C9210 Chronic myeloid leukemia, BCR/ABL-positive, not having achieved remission: Secondary | ICD-10-CM | POA: Diagnosis not present

## 2019-06-16 DIAGNOSIS — C9210 Chronic myeloid leukemia, BCR/ABL-positive, not having achieved remission: Secondary | ICD-10-CM | POA: Diagnosis not present

## 2019-06-21 DIAGNOSIS — F0390 Unspecified dementia without behavioral disturbance: Secondary | ICD-10-CM | POA: Diagnosis not present

## 2019-06-21 DIAGNOSIS — J90 Pleural effusion, not elsewhere classified: Secondary | ICD-10-CM | POA: Diagnosis not present

## 2019-06-21 DIAGNOSIS — F0393 Unspecified dementia, unspecified severity, with mood disturbance: Secondary | ICD-10-CM | POA: Insufficient documentation

## 2019-06-21 DIAGNOSIS — F0150 Vascular dementia without behavioral disturbance: Secondary | ICD-10-CM | POA: Diagnosis not present

## 2019-06-23 ENCOUNTER — Other Ambulatory Visit: Payer: Self-pay

## 2019-06-29 DIAGNOSIS — R21 Rash and other nonspecific skin eruption: Secondary | ICD-10-CM | POA: Diagnosis not present

## 2019-07-20 DIAGNOSIS — F0390 Unspecified dementia without behavioral disturbance: Secondary | ICD-10-CM | POA: Diagnosis not present

## 2019-07-20 DIAGNOSIS — I10 Essential (primary) hypertension: Secondary | ICD-10-CM | POA: Diagnosis not present

## 2019-07-20 DIAGNOSIS — Z9889 Other specified postprocedural states: Secondary | ICD-10-CM | POA: Diagnosis not present

## 2019-07-20 DIAGNOSIS — F0150 Vascular dementia without behavioral disturbance: Secondary | ICD-10-CM | POA: Diagnosis not present

## 2019-07-20 DIAGNOSIS — E039 Hypothyroidism, unspecified: Secondary | ICD-10-CM | POA: Diagnosis not present

## 2019-07-20 DIAGNOSIS — Z950 Presence of cardiac pacemaker: Secondary | ICD-10-CM | POA: Diagnosis not present

## 2019-07-20 DIAGNOSIS — Z45018 Encounter for adjustment and management of other part of cardiac pacemaker: Secondary | ICD-10-CM | POA: Diagnosis not present

## 2019-07-21 ENCOUNTER — Telehealth (INDEPENDENT_AMBULATORY_CARE_PROVIDER_SITE_OTHER): Payer: Medicare Other | Admitting: Gastroenterology

## 2019-07-21 ENCOUNTER — Other Ambulatory Visit: Payer: Self-pay

## 2019-07-21 VITALS — Ht 62.0 in | Wt 134.0 lb

## 2019-07-21 DIAGNOSIS — R195 Other fecal abnormalities: Secondary | ICD-10-CM

## 2019-07-21 NOTE — Patient Instructions (Signed)
If you are age 83 or older, your body mass index should be between 23-30. Your Body mass index is 24.51 kg/m. If this is out of the aforementioned range listed, please consider follow up with your Primary Care Provider.  If you are age 92 or younger, your body mass index should be between 19-25. Your Body mass index is 24.51 kg/m. If this is out of the aformentioned range listed, please consider follow up with your Primary Care Provider.   To help prevent the possible spread of infection to our patients, communities, and staff; we will be implementing the following measures:  As of now we are not allowing any visitors/family members to accompany you to any upcoming appointments with [facility]. If you have any concerns about this please contact our office to discuss prior to the appointment.   Your provider has requested that you have a repeat CBC with Differentials in 6-8 weeks at Dr. Unk Lightning Office.  Eat small but frequent meals and drink 1 Ensure daily.  Please call our office at [phone] to set up your 6 month follow up visit.  Thank you,  Dr. Jackquline Denmark

## 2019-07-21 NOTE — Progress Notes (Signed)
Chief Complaint: Heme positive stools  Referring Provider: Dr. Unk Lightning      ASSESSMENT AND PLAN;   #1. Heme positive stools. Hb 11.5 (05/10/2019, baseline 12.4 04/2017). Neg EGD and colon 03/2014 except for atrophic gastritis and mod pancolonic diverticulosis.  #2. Wt Loss (20lb over 2 yrs) (resolved), gained 3lb since the last visit.Marland Kitchen Nl TSH, CBC except hemoglobin 11.5, normal CMP including albumin 4.3 and Nl Cr 0.76 (05/10/2019). CT AP 05/2019 showing early liver cirrhosis.  No portal hypertension.  #3. Comorbid problems including dementia, A.Fib, HTN, CML, pacemaker, hypothyroidism chronic back pain d/t DJD and advanced age.   Plan: - Small but more frequent meals. - Ensure 1/day. - Patient does not want to go through any invasive endoscopic procedures including EGD or repeat colonoscopy.  She does understand that there is small but definite risks of missing colorectal neoplasms.  She is willing to take a chance. -Trend CBC (can get follow-up CBC in 6 to 8 weeks at Dr. Unk Lightning office). - FU in 6 months.  Earlier if with any new problems. - D/w with [name] (patient's daughter-in-law).   HPI:    Joanna Herrera is a 85 y.o. female  History is mainly from daughter-in-law [name] Does feel significantly better Has gained 3 pounds since the last visit.  Had blood work performed recently.  We do not have the records.  She was told that it looked good.  CT Abdo/pelvis-early liver cirrhosis, no portal hypertension,   Likely due to CHF.  Has been seen by Dr. Elvis Coil, metoprolol has been increased to 50 mg p.o. once a day.  She is quite pleased with the progress.  Denies having any nausea, vomiting, heartburn, regurgitation, odynophagia or dysphagia.  Denies having any significant diarrhea or constipation.  She has progressive dementia.  No nonsteroidals.  Has lost 20 pounds over the last 2 years.  Being followed by Dr. Bobby Rumpf for [facility].  Been stable.  Has moderate to severe chronic  back pain.  Had CT lumbar spine previously which showed significant DJD, spinal stenosis and scoliosis.  Labs - Nl TSH, CBC except hemoglobin 11.5, normal CMP including albumin 4.3 (05/10/2019)  Past GI procedures: -Colonoscopy 04/23/2014-fair prep, moderate pancolonic diverticulosis. -EGD 04/23/2014-atrophic gastritis, otherwise normal EGD.  Negative SB Bx. Past Medical History:  Diagnosis Date  . Anorexia   . Anxiety   . Anxiety and depression   . Atrial fibrillation ([place])   . Chronic venous insufficiency   . CLL (chronic lymphocytic leukemia) ([place])   . Congestive heart failure (CHF) ([place])   . Dementia ([place])   . Depression   . Dyslipidemia   . Fibroid   . GERD (gastroesophageal reflux disease)   . Hypertension   . Hypothyroidism   . Left kidney mass   . Memory loss   . Nonischemic cardiomyopathy ([place])   . Valvular heart disease   . Varicose vein of leg     Past Surgical History:  Procedure Laterality Date  . ABDOMINAL HYSTERECTOMY    . CARDIAC ELECTROPHYSIOLOGY STUDY AND ABLATION    . CHOLECYSTECTOMY    . COLONOSCOPY  04/23/2014   Moderate pancolonic diverticulosis, otherwise normal   . ESOPHAGOGASTRODUODENOSCOPY  04/23/2014   Astrophic gastritis, otherwise normal EGD.   Marland Kitchen Left heart catheterization    . PACEMAKER IMPLANT    . Replacement of dual chamber pulse generator      Family History  Problem Relation Age of Onset  . Heart disease Mother   . Diabetes Mother   .  Diabetes Father   . Liver cancer Sister   . Colon cancer Neg Hx     Social History   Tobacco Use  . Smoking status: Never Smoker  . Smokeless tobacco: Never Used  Substance Use Topics  . Alcohol use: No  . Drug use: No    Current Outpatient Medications  Medication Sig Dispense Refill  . acetaminophen (TYLENOL) 500 MG tablet Take 500 mg by mouth as needed.     Marland Kitchen aspirin 81 MG tablet Take 81 mg by mouth daily.    . baclofen (LIORESAL) 10 MG tablet 1 tablet as needed.    . Dasatinib  (SPRYCEL PO) Take 40 mg by mouth daily.    Marland Kitchen donepezil (ARICEPT) 10 MG tablet Take 1 tablet (10 mg total) by mouth at bedtime. 90 tablet 4  . DULoxetine (CYMBALTA) 60 MG capsule Take 60 mg by mouth daily.    . furosemide (LASIX) 20 MG tablet Take 20 mg by mouth.    . levothyroxine (SYNTHROID, LEVOTHROID) 75 MCG tablet Take 75 mcg by mouth daily with breakfast.     . lisinopril (PRINIVIL,ZESTRIL) 2.5 MG tablet Take 1 tablet by mouth daily.    . memantine (NAMENDA) 10 MG tablet Take 1 tablet (10 mg total) by mouth 2 (two) times daily. 180 tablet 4  . metoprolol succinate (TOPROL-XL) 25 MG 24 hr tablet Take 25 mg by mouth daily.    . potassium chloride (K-DUR) 10 MEQ tablet Take 1 tablet by mouth daily.     No current facility-administered medications for this visit.     No Known Allergies  Review of Systems:  Has fatigue, decreased appetite Chronic back pain Leg pain Has a pacemaker-not a candidate for MRI of the back.     Physical Exam:    Ht 5\' 2"  (1.575 m)   Wt 134 lb (60.8 kg) Comment: verbalized  BMI 24.51 kg/m  Filed Weights   07/21/19 0855  Weight: 134 lb (60.8 kg)  televisit  This service was provided via telemedicine.  The patient was located at home.  The provider was located in office.  The patient did consent to this telephone visit and is aware of possible charges through their insurance for this visit.  The patient was referred by Dr. Unk Lightning.  The other persons participating in this telemedicine service were patient's daughter-in-law [name] and their role was caregiver.  Time spent on call/coordination of care/review of records: 15 min    Carmell Austria, MD 07/21/2019, 3:30 PM  Cc: Dr Unk Lightning

## 2019-09-02 DIAGNOSIS — Z950 Presence of cardiac pacemaker: Secondary | ICD-10-CM | POA: Diagnosis not present

## 2019-10-29 DIAGNOSIS — C9210 Chronic myeloid leukemia, BCR/ABL-positive, not having achieved remission: Secondary | ICD-10-CM | POA: Diagnosis not present

## 2019-11-05 DIAGNOSIS — D509 Iron deficiency anemia, unspecified: Secondary | ICD-10-CM | POA: Diagnosis not present

## 2019-11-05 DIAGNOSIS — C9210 Chronic myeloid leukemia, BCR/ABL-positive, not having achieved remission: Secondary | ICD-10-CM | POA: Diagnosis not present

## 2019-12-02 DIAGNOSIS — Z950 Presence of cardiac pacemaker: Secondary | ICD-10-CM | POA: Diagnosis not present

## 2020-02-25 DIAGNOSIS — J90 Pleural effusion, not elsewhere classified: Secondary | ICD-10-CM | POA: Diagnosis not present

## 2020-02-25 DIAGNOSIS — R718 Other abnormality of red blood cells: Secondary | ICD-10-CM | POA: Diagnosis not present

## 2020-02-25 DIAGNOSIS — R0602 Shortness of breath: Secondary | ICD-10-CM | POA: Diagnosis not present

## 2020-02-25 DIAGNOSIS — R5381 Other malaise: Secondary | ICD-10-CM | POA: Diagnosis not present

## 2020-02-25 DIAGNOSIS — R5383 Other fatigue: Secondary | ICD-10-CM | POA: Diagnosis not present

## 2020-02-25 DIAGNOSIS — N3001 Acute cystitis with hematuria: Secondary | ICD-10-CM | POA: Diagnosis not present

## 2020-02-28 DIAGNOSIS — C9210 Chronic myeloid leukemia, BCR/ABL-positive, not having achieved remission: Secondary | ICD-10-CM | POA: Diagnosis not present

## 2020-03-02 DIAGNOSIS — Z45018 Encounter for adjustment and management of other part of cardiac pacemaker: Secondary | ICD-10-CM | POA: Diagnosis not present

## 2020-03-02 DIAGNOSIS — I4811 Longstanding persistent atrial fibrillation: Secondary | ICD-10-CM | POA: Diagnosis not present

## 2020-03-02 DIAGNOSIS — Z9889 Other specified postprocedural states: Secondary | ICD-10-CM | POA: Diagnosis not present

## 2020-03-06 DIAGNOSIS — C9210 Chronic myeloid leukemia, BCR/ABL-positive, not having achieved remission: Secondary | ICD-10-CM

## 2020-03-06 DIAGNOSIS — D509 Iron deficiency anemia, unspecified: Secondary | ICD-10-CM

## 2020-03-20 DIAGNOSIS — N3001 Acute cystitis with hematuria: Secondary | ICD-10-CM | POA: Diagnosis not present

## 2020-03-20 DIAGNOSIS — R531 Weakness: Secondary | ICD-10-CM | POA: Diagnosis not present

## 2020-04-03 DIAGNOSIS — R8281 Pyuria: Secondary | ICD-10-CM | POA: Diagnosis not present

## 2020-05-24 DIAGNOSIS — M47814 Spondylosis without myelopathy or radiculopathy, thoracic region: Secondary | ICD-10-CM | POA: Diagnosis not present

## 2020-05-24 DIAGNOSIS — M545 Low back pain: Secondary | ICD-10-CM | POA: Diagnosis not present

## 2020-05-24 DIAGNOSIS — M791 Myalgia, unspecified site: Secondary | ICD-10-CM | POA: Diagnosis not present

## 2020-05-24 DIAGNOSIS — M7918 Myalgia, other site: Secondary | ICD-10-CM | POA: Diagnosis not present

## 2020-05-31 DIAGNOSIS — J329 Chronic sinusitis, unspecified: Secondary | ICD-10-CM | POA: Diagnosis not present

## 2020-06-09 DIAGNOSIS — J0100 Acute maxillary sinusitis, unspecified: Secondary | ICD-10-CM | POA: Diagnosis not present

## 2020-06-09 DIAGNOSIS — R05 Cough: Secondary | ICD-10-CM | POA: Diagnosis not present

## 2020-06-13 DIAGNOSIS — Z45018 Encounter for adjustment and management of other part of cardiac pacemaker: Secondary | ICD-10-CM | POA: Diagnosis not present

## 2020-06-22 DIAGNOSIS — M545 Low back pain: Secondary | ICD-10-CM | POA: Diagnosis not present

## 2020-06-22 DIAGNOSIS — M47814 Spondylosis without myelopathy or radiculopathy, thoracic region: Secondary | ICD-10-CM | POA: Diagnosis not present

## 2020-06-22 DIAGNOSIS — I10 Essential (primary) hypertension: Secondary | ICD-10-CM | POA: Diagnosis not present

## 2020-06-22 DIAGNOSIS — M791 Myalgia, unspecified site: Secondary | ICD-10-CM | POA: Diagnosis not present

## 2020-06-23 DIAGNOSIS — F0150 Vascular dementia without behavioral disturbance: Secondary | ICD-10-CM | POA: Diagnosis not present

## 2020-06-23 DIAGNOSIS — Z7189 Other specified counseling: Secondary | ICD-10-CM | POA: Diagnosis not present

## 2020-06-23 DIAGNOSIS — R413 Other amnesia: Secondary | ICD-10-CM | POA: Diagnosis not present

## 2020-06-27 DIAGNOSIS — M47812 Spondylosis without myelopathy or radiculopathy, cervical region: Secondary | ICD-10-CM | POA: Diagnosis not present

## 2020-06-27 DIAGNOSIS — M47814 Spondylosis without myelopathy or radiculopathy, thoracic region: Secondary | ICD-10-CM | POA: Diagnosis not present

## 2020-06-30 DIAGNOSIS — D509 Iron deficiency anemia, unspecified: Secondary | ICD-10-CM | POA: Diagnosis not present

## 2020-06-30 DIAGNOSIS — C9210 Chronic myeloid leukemia, BCR/ABL-positive, not having achieved remission: Secondary | ICD-10-CM | POA: Diagnosis not present

## 2020-07-06 DIAGNOSIS — D509 Iron deficiency anemia, unspecified: Secondary | ICD-10-CM | POA: Diagnosis not present

## 2020-07-21 DIAGNOSIS — G8929 Other chronic pain: Secondary | ICD-10-CM | POA: Diagnosis not present

## 2020-07-21 DIAGNOSIS — M545 Low back pain: Secondary | ICD-10-CM | POA: Diagnosis not present

## 2020-09-12 DIAGNOSIS — Z950 Presence of cardiac pacemaker: Secondary | ICD-10-CM | POA: Diagnosis not present

## 2020-10-16 NOTE — Progress Notes (Signed)
Spoke with patients daughter in law [name], about reenrolling patient for free Sprycel through [employer]. They will be in on 11/23@1 :30 to fill out paperwork.

## 2020-10-17 NOTE — Progress Notes (Signed)
Patient was in today to fill out application thru [employer] for Sprycel for the 2022 year. Will fax in once Dr. Bobby Rumpf signs.

## 2020-11-13 NOTE — Progress Notes (Signed)
Patient was approved for free Sprycel through [employer], 11/25/2020-11/24/2021. I called and spoke with patients daughter in law, [name]. Let her know of approval and told them if they have any issues to give me a call.

## 2020-11-15 ENCOUNTER — Other Ambulatory Visit: Payer: Self-pay | Admitting: Oncology

## 2020-11-15 ENCOUNTER — Telehealth: Payer: Self-pay | Admitting: Oncology

## 2020-11-15 NOTE — Telephone Encounter (Signed)
Patient needs appt

## 2020-11-23 ENCOUNTER — Telehealth: Payer: Self-pay | Admitting: Oncology

## 2020-11-23 NOTE — Telephone Encounter (Signed)
11/23/20 Spoke with daughter-in-law and sched appts

## 2020-12-12 DIAGNOSIS — Z45018 Encounter for adjustment and management of other part of cardiac pacemaker: Secondary | ICD-10-CM | POA: Diagnosis not present

## 2020-12-13 ENCOUNTER — Other Ambulatory Visit: Payer: Self-pay | Admitting: Hematology and Oncology

## 2020-12-13 ENCOUNTER — Inpatient Hospital Stay: Payer: PPO | Attending: Oncology

## 2020-12-13 ENCOUNTER — Other Ambulatory Visit: Payer: Self-pay

## 2020-12-13 DIAGNOSIS — C9210 Chronic myeloid leukemia, BCR/ABL-positive, not having achieved remission: Secondary | ICD-10-CM

## 2020-12-13 DIAGNOSIS — D649 Anemia, unspecified: Secondary | ICD-10-CM | POA: Diagnosis not present

## 2020-12-13 DIAGNOSIS — D509 Iron deficiency anemia, unspecified: Secondary | ICD-10-CM | POA: Diagnosis not present

## 2020-12-13 LAB — BASIC METABOLIC PANEL
BUN: 18 (ref 4–21)
CO2: 28 — AB (ref 13–22)
Chloride: 104 (ref 99–108)
Creatinine: 0.7 (ref 0.5–1.1)
Glucose: 156
Potassium: 3.5 (ref 3.4–5.3)
Sodium: 141 (ref 137–147)

## 2020-12-13 LAB — HEPATIC FUNCTION PANEL
ALT: 8 (ref 7–35)
AST: 29 (ref 13–35)
Alkaline Phosphatase: 69 (ref 25–125)
Bilirubin, Total: 0.6

## 2020-12-13 LAB — CBC AND DIFFERENTIAL
HCT: 37 (ref 36–46)
Hemoglobin: 12 (ref 12.0–16.0)
Neutrophils Absolute: 2.82
Platelets: 266 (ref 150–399)
WBC: 4.7

## 2020-12-13 LAB — CBC: RBC: 3.94 (ref 3.87–5.11)

## 2020-12-13 LAB — COMPREHENSIVE METABOLIC PANEL
Albumin: 4.1 (ref 3.5–5.0)
Calcium: 9.2 (ref 8.7–10.7)

## 2020-12-20 ENCOUNTER — Inpatient Hospital Stay: Payer: PPO | Attending: Oncology | Admitting: Oncology

## 2020-12-20 ENCOUNTER — Telehealth: Payer: Self-pay | Admitting: Oncology

## 2020-12-20 ENCOUNTER — Other Ambulatory Visit: Payer: Self-pay

## 2020-12-20 ENCOUNTER — Other Ambulatory Visit: Payer: Self-pay | Admitting: Oncology

## 2020-12-20 VITALS — BP 181/78 | HR 80 | Temp 97.5°F | Resp 16 | Ht 63.0 in | Wt 135.0 lb

## 2020-12-20 DIAGNOSIS — C9210 Chronic myeloid leukemia, BCR/ABL-positive, not having achieved remission: Secondary | ICD-10-CM

## 2020-12-20 NOTE — Telephone Encounter (Signed)
12/20/20 Spoke with patient and sched appts

## 2020-12-20 NOTE — Telephone Encounter (Signed)
12/20/20 next appt sched and given to patient

## 2020-12-20 NOTE — Progress Notes (Signed)
[facility]  [address] [place],  [place]  [address] [phone]  Clinic Day:  12/20/2020  Referring physician: Myrlene Broker, MD   HISTORY OF PRESENT ILLNESS:  The patient is a 87 y.o. female with chronic myelogenous leukemia.  She has been taking dasatinib 40 mg daily to keep this disease under ideal control.  She comes in today for routine follow-up.  Since her last visit, the patient has been doing okay.  She remains compliant with taking her dasatinib on a daily basis.  She denies having any B symptoms or other findings which concern her for chronic myelogenous leukemia losing sensitivity to her dasatinib therapy.    PHYSICAL EXAM:  Blood pressure (!) 181/78, pulse 80, temperature (!) 97.5 F (36.4 C), resp. rate 16, height 5\' 3"  (1.6 m), weight 135 lb (61.2 kg), SpO2 94 %. Wt Readings from Last 3 Encounters:  12/20/20 135 lb (61.2 kg)  07/21/19 134 lb (60.8 kg)  05/31/19 135 lb (61.2 kg)   Body mass index is 23.91 kg/m. Performance status (ECOG): 1 Physical Exam Constitutional:      Appearance: Normal appearance. She is not ill-appearing.  HENT:     Mouth/Throat:     Mouth: Mucous membranes are moist.     Pharynx: Oropharynx is clear. No oropharyngeal exudate or posterior oropharyngeal erythema.  Cardiovascular:     Rate and Rhythm: Normal rate and regular rhythm.     Heart sounds: No murmur heard. No friction rub. No gallop.   Pulmonary:     Effort: Pulmonary effort is normal. No respiratory distress.     Breath sounds: Normal breath sounds. No wheezing, rhonchi or rales.  Chest:  Breasts:     Right: No axillary adenopathy or supraclavicular adenopathy.     Left: No axillary adenopathy or supraclavicular adenopathy.    Abdominal:     General: Bowel sounds are normal. There is no distension.     Palpations: Abdomen is soft. There is no mass.     Tenderness: There is no abdominal tenderness.  Musculoskeletal:         General: No swelling.     Right lower leg: No edema.     Left lower leg: No edema.  Lymphadenopathy:     Cervical: No cervical adenopathy.     Upper Body:     Right upper body: No supraclavicular or axillary adenopathy.     Left upper body: No supraclavicular or axillary adenopathy.     Lower Body: No right inguinal adenopathy. No left inguinal adenopathy.  Skin:    General: Skin is warm.     Coloration: Skin is not jaundiced.     Findings: No lesion or rash.  Neurological:     General: No focal deficit present.     Mental Status: She is alert and oriented to person, place, and time. Mental status is at baseline.     Cranial Nerves: Cranial nerves are intact.  Psychiatric:        Mood and Affect: Mood normal.        Behavior: Behavior normal.        Thought Content: Thought content normal.     LABS:   CBC Latest Ref Rng & Units 12/13/2020  WBC - 4.7  Hemoglobin 12.0 - 16.0 12.0  Hematocrit 36 - 46 37  Platelets 150 - 399 266   CMP Latest Ref Rng & Units 12/13/2020  BUN 4 - 21 18  Creatinine 0.5 -  1.1 0.7  Sodium 137 - 147 141  Potassium 3.4 - 5.3 3.5  Chloride 99 - 108 104  CO2 13 - 22 28(A)  Calcium 8.7 - 10.7 9.2  Alkaline Phos 25 - 125 69  AST 13 - 35 29  ALT 7 - 35 8    ASSESSMENT & PLAN:  Assessment/Plan:  A 87 y.o. female with chronic myelogenous leukemia.  I remain pleased as this patient's peripheral counts reman ideal while on her dasatinib.  As the patient is clinically doing very well and is exhibiting no side effects from her dasatinib, she will continue to take this medicine at 40 mg daily.  As she is doing well from a CML perspective, I will see her back in 4 months for repeat clinical assessment.  The patient and her family understand all the plans discussed today and are in agreement with them.  Makaylen Thieme Macarthur Critchley, MD

## 2020-12-21 ENCOUNTER — Telehealth: Payer: Self-pay | Admitting: Family Medicine

## 2020-12-21 NOTE — Telephone Encounter (Signed)
Pt states she has had her covid vaccines and her booster.

## 2021-03-06 DIAGNOSIS — M47816 Spondylosis without myelopathy or radiculopathy, lumbar region: Secondary | ICD-10-CM | POA: Diagnosis not present

## 2021-03-06 DIAGNOSIS — M47812 Spondylosis without myelopathy or radiculopathy, cervical region: Secondary | ICD-10-CM | POA: Diagnosis not present

## 2021-03-06 DIAGNOSIS — M545 Low back pain, unspecified: Secondary | ICD-10-CM | POA: Diagnosis not present

## 2021-03-06 DIAGNOSIS — M47814 Spondylosis without myelopathy or radiculopathy, thoracic region: Secondary | ICD-10-CM | POA: Diagnosis not present

## 2021-03-08 DIAGNOSIS — Z45018 Encounter for adjustment and management of other part of cardiac pacemaker: Secondary | ICD-10-CM | POA: Diagnosis not present

## 2021-03-13 DIAGNOSIS — M47816 Spondylosis without myelopathy or radiculopathy, lumbar region: Secondary | ICD-10-CM | POA: Diagnosis not present

## 2021-03-26 DIAGNOSIS — M47816 Spondylosis without myelopathy or radiculopathy, lumbar region: Secondary | ICD-10-CM | POA: Diagnosis not present

## 2021-04-12 ENCOUNTER — Inpatient Hospital Stay: Payer: PPO | Attending: Oncology

## 2021-04-12 ENCOUNTER — Encounter: Payer: Self-pay | Admitting: Hematology and Oncology

## 2021-04-12 ENCOUNTER — Other Ambulatory Visit: Payer: Self-pay

## 2021-04-12 DIAGNOSIS — M545 Low back pain, unspecified: Secondary | ICD-10-CM | POA: Diagnosis not present

## 2021-04-12 DIAGNOSIS — C9210 Chronic myeloid leukemia, BCR/ABL-positive, not having achieved remission: Secondary | ICD-10-CM

## 2021-04-12 DIAGNOSIS — I10 Essential (primary) hypertension: Secondary | ICD-10-CM | POA: Diagnosis not present

## 2021-04-12 DIAGNOSIS — R600 Localized edema: Secondary | ICD-10-CM | POA: Diagnosis not present

## 2021-04-12 DIAGNOSIS — Z950 Presence of cardiac pacemaker: Secondary | ICD-10-CM | POA: Diagnosis not present

## 2021-04-12 DIAGNOSIS — G8929 Other chronic pain: Secondary | ICD-10-CM | POA: Diagnosis not present

## 2021-04-12 DIAGNOSIS — D649 Anemia, unspecified: Secondary | ICD-10-CM | POA: Diagnosis not present

## 2021-04-12 DIAGNOSIS — E039 Hypothyroidism, unspecified: Secondary | ICD-10-CM | POA: Diagnosis not present

## 2021-04-12 DIAGNOSIS — F0390 Unspecified dementia without behavioral disturbance: Secondary | ICD-10-CM | POA: Diagnosis not present

## 2021-04-12 LAB — CBC: RBC: 4.28 (ref 3.87–5.11)

## 2021-04-12 LAB — CBC AND DIFFERENTIAL
HCT: 39 (ref 36–46)
Hemoglobin: 12.4 (ref 12.0–16.0)
Neutrophils Absolute: 2.56
Platelets: 197 (ref 150–399)
WBC: 4.2

## 2021-04-16 NOTE — Progress Notes (Signed)
[facility]  [address] [place],  [place]  [address] [phone]  Clinic Day:  04/19/2021  Referring physician: Myrlene Broker, MD  This document serves as a record of services personally performed by Dequincy Macarthur Critchley, MD. It was created on their behalf by [facility] E, a trained medical scribe. The creation of this record is based on the scribe's personal observations and the provider's statements to them.  HISTORY OF PRESENT ILLNESS:  The patient is a 87 y.o. female with chronic myelogenous leukemia.  She has been taking dasatinib 40 mg daily to keep this disease under ideal control.  Previous quantitative bcr-abl tests have shown her to be in a complete molecular response.  She comes in today for routine follow-up.  Since her last visit, the patient has been doing okay.  She remains compliant with taking her dasatinib on a daily basis.  She denies having any B symptoms or other findings which concern her for chronic myelogenous leukemia losing sensitivity to her dasatinib therapy.   PHYSICAL EXAM:  Blood pressure (!) 185/84, pulse 74, temperature 97.8 F (36.6 C), resp. rate 16, height 5\' 3"  (1.6 m), weight 129 lb 14.4 oz (58.9 kg), SpO2 95 %. Wt Readings from Last 3 Encounters:  04/19/21 129 lb 14.4 oz (58.9 kg)  12/20/20 135 lb (61.2 kg)  07/21/19 134 lb (60.8 kg)   Body mass index is 23.01 kg/m. Performance status (ECOG): 1 Physical Exam Constitutional:      Appearance: Normal appearance. She is not ill-appearing.  HENT:     Mouth/Throat:     Mouth: Mucous membranes are moist.     Pharynx: Oropharynx is clear. No oropharyngeal exudate or posterior oropharyngeal erythema.  Cardiovascular:     Rate and Rhythm: Normal rate and regular rhythm.     Heart sounds: No murmur heard. No friction rub. No gallop.   Pulmonary:     Effort: Pulmonary effort is normal. No respiratory distress.     Breath sounds: Normal breath sounds. No wheezing,  rhonchi or rales.  Chest:  Breasts:     Right: No axillary adenopathy or supraclavicular adenopathy.     Left: No axillary adenopathy or supraclavicular adenopathy.    Abdominal:     General: Bowel sounds are normal. There is no distension.     Palpations: Abdomen is soft. There is no mass.     Tenderness: There is no abdominal tenderness.  Musculoskeletal:        General: No swelling.     Right lower leg: No edema.     Left lower leg: No edema.  Lymphadenopathy:     Cervical: No cervical adenopathy.     Upper Body:     Right upper body: No supraclavicular or axillary adenopathy.     Left upper body: No supraclavicular or axillary adenopathy.     Lower Body: No right inguinal adenopathy. No left inguinal adenopathy.  Skin:    General: Skin is warm.     Coloration: Skin is not jaundiced.     Findings: No lesion or rash.  Neurological:     General: No focal deficit present.     Mental Status: She is alert and oriented to person, place, and time. Mental status is at baseline.     Cranial Nerves: Cranial nerves are intact.  Psychiatric:        Mood and Affect: Mood normal.        Behavior: Behavior normal.  Thought Content: Thought content normal.     LABS:   CBC Latest Ref Rng & Units 04/12/2021 12/13/2020  WBC - 4.2 4.7  Hemoglobin 12.0 - 16.0 12.4 12.0  Hematocrit 36 - 46 39 37  Platelets 150 - 399 197 266   CMP Latest Ref Rng & Units 12/13/2020  BUN 4 - 21 18  Creatinine 0.5 - 1.1 0.7  Sodium 137 - 147 141  Potassium 3.4 - 5.3 3.5  Chloride 99 - 108 104  CO2 13 - 22 28(A)  Calcium 8.7 - 10.7 9.2  Alkaline Phos 25 - 125 69  AST 13 - 35 29  ALT 7 - 35 8    ASSESSMENT & PLAN:  Assessment/Plan:  A 87 y.o. female with chronic myelogenous leukemia.  I remain pleased as this patient's peripheral counts reman ideal while on her dasatinib.  As the patient is clinically doing very well and is exhibiting no side effects from her dasatinib, she will continue to take  this medicine at 40 mg daily.  I will see her back in 4 months for repeat clinical assessment. The patient and her family understand all the plans discussed today and are in agreement with them.   I, Rita Ohara, am acting as scribe for Marice Potter, MD    I have reviewed this report as typed by the medical scribe, and it is complete and accurate.  Dequincy Macarthur Critchley, MD

## 2021-04-19 ENCOUNTER — Other Ambulatory Visit: Payer: Self-pay | Admitting: Oncology

## 2021-04-19 ENCOUNTER — Telehealth: Payer: Self-pay | Admitting: Hematology and Oncology

## 2021-04-19 ENCOUNTER — Inpatient Hospital Stay: Payer: PPO | Admitting: Oncology

## 2021-04-19 ENCOUNTER — Other Ambulatory Visit: Payer: Self-pay

## 2021-04-19 VITALS — BP 185/84 | HR 74 | Temp 97.8°F | Resp 16 | Ht 63.0 in | Wt 129.9 lb

## 2021-04-19 DIAGNOSIS — C9210 Chronic myeloid leukemia, BCR/ABL-positive, not having achieved remission: Secondary | ICD-10-CM | POA: Diagnosis not present

## 2021-04-19 NOTE — Telephone Encounter (Signed)
Per 5/26 los next appt scheduled and confirmed by patient

## 2021-04-25 ENCOUNTER — Telehealth: Payer: Self-pay

## 2021-04-25 NOTE — Telephone Encounter (Signed)
Called [phone] to try and schedule a lab apt for new arth. For BCR-ABL.Joanna Herrera. no answer. I was unable to leave VM.

## 2021-05-01 ENCOUNTER — Encounter: Payer: Self-pay | Admitting: Oncology

## 2021-05-01 NOTE — Progress Notes (Unsigned)
Received PA for CPT 81206/81207 from [facility]; valid 04/12/2021 - 07/11/2021: GOOD FOR ONLY 1 (ONE) VISIT

## 2021-05-10 ENCOUNTER — Other Ambulatory Visit: Payer: PPO

## 2021-05-10 ENCOUNTER — Other Ambulatory Visit: Payer: Self-pay | Admitting: Hematology and Oncology

## 2021-05-10 DIAGNOSIS — C9210 Chronic myeloid leukemia, BCR/ABL-positive, not having achieved remission: Secondary | ICD-10-CM

## 2021-05-11 ENCOUNTER — Encounter: Payer: Self-pay | Admitting: Hematology and Oncology

## 2021-05-11 ENCOUNTER — Other Ambulatory Visit: Payer: Self-pay

## 2021-05-11 ENCOUNTER — Inpatient Hospital Stay: Payer: PPO | Attending: Oncology

## 2021-05-11 DIAGNOSIS — C9210 Chronic myeloid leukemia, BCR/ABL-positive, not having achieved remission: Secondary | ICD-10-CM | POA: Insufficient documentation

## 2021-05-11 LAB — COMPREHENSIVE METABOLIC PANEL WITH GFR
Albumin: 4.4 (ref 3.5–5.0)
Calcium: 9 (ref 8.7–10.7)

## 2021-05-11 LAB — BASIC METABOLIC PANEL WITH GFR
BUN: 12 (ref 4–21)
CO2: 31 — AB (ref 13–22)
Chloride: 104 (ref 99–108)
Creatinine: 0.6 (ref 0.5–1.1)
Glucose: 140
Potassium: 3.7 (ref 3.4–5.3)
Sodium: 142 (ref 137–147)

## 2021-05-11 LAB — CBC AND DIFFERENTIAL
HCT: 37 (ref 36–46)
Hemoglobin: 12.2 (ref 12.0–16.0)
Neutrophils Absolute: 2.7
Platelets: 196 (ref 150–399)
WBC: 4.5

## 2021-05-11 LAB — HEPATIC FUNCTION PANEL
ALT: 8 (ref 7–35)
AST: 27 (ref 13–35)
Alkaline Phosphatase: 53 (ref 25–125)
Bilirubin, Total: 0.6

## 2021-05-11 LAB — CBC: RBC: 3.96 (ref 3.87–5.11)

## 2021-05-16 DIAGNOSIS — M47817 Spondylosis without myelopathy or radiculopathy, lumbosacral region: Secondary | ICD-10-CM | POA: Diagnosis not present

## 2021-05-16 DIAGNOSIS — M47816 Spondylosis without myelopathy or radiculopathy, lumbar region: Secondary | ICD-10-CM | POA: Diagnosis not present

## 2021-05-17 LAB — BCR-ABL1, CML/ALL, PCR, QUANT: Interpretation (BCRAL):: NEGATIVE

## 2021-06-07 DIAGNOSIS — Z950 Presence of cardiac pacemaker: Secondary | ICD-10-CM | POA: Diagnosis not present

## 2021-06-14 DIAGNOSIS — M47816 Spondylosis without myelopathy or radiculopathy, lumbar region: Secondary | ICD-10-CM | POA: Diagnosis not present

## 2021-07-02 DIAGNOSIS — R413 Other amnesia: Secondary | ICD-10-CM | POA: Diagnosis not present

## 2021-07-02 DIAGNOSIS — F0150 Vascular dementia without behavioral disturbance: Secondary | ICD-10-CM | POA: Diagnosis not present

## 2021-07-02 DIAGNOSIS — Z7189 Other specified counseling: Secondary | ICD-10-CM | POA: Diagnosis not present

## 2021-07-19 DIAGNOSIS — M461 Sacroiliitis, not elsewhere classified: Secondary | ICD-10-CM | POA: Diagnosis not present

## 2021-07-19 DIAGNOSIS — M9903 Segmental and somatic dysfunction of lumbar region: Secondary | ICD-10-CM | POA: Diagnosis not present

## 2021-07-19 DIAGNOSIS — M9905 Segmental and somatic dysfunction of pelvic region: Secondary | ICD-10-CM | POA: Diagnosis not present

## 2021-07-19 DIAGNOSIS — M5451 Vertebrogenic low back pain: Secondary | ICD-10-CM | POA: Diagnosis not present

## 2021-07-19 DIAGNOSIS — M9902 Segmental and somatic dysfunction of thoracic region: Secondary | ICD-10-CM | POA: Diagnosis not present

## 2021-07-24 DIAGNOSIS — M9903 Segmental and somatic dysfunction of lumbar region: Secondary | ICD-10-CM | POA: Diagnosis not present

## 2021-07-24 DIAGNOSIS — M9902 Segmental and somatic dysfunction of thoracic region: Secondary | ICD-10-CM | POA: Diagnosis not present

## 2021-07-24 DIAGNOSIS — M5451 Vertebrogenic low back pain: Secondary | ICD-10-CM | POA: Diagnosis not present

## 2021-07-24 DIAGNOSIS — M9905 Segmental and somatic dysfunction of pelvic region: Secondary | ICD-10-CM | POA: Diagnosis not present

## 2021-07-24 DIAGNOSIS — M461 Sacroiliitis, not elsewhere classified: Secondary | ICD-10-CM | POA: Diagnosis not present

## 2021-07-27 DIAGNOSIS — M461 Sacroiliitis, not elsewhere classified: Secondary | ICD-10-CM | POA: Diagnosis not present

## 2021-07-27 DIAGNOSIS — M9902 Segmental and somatic dysfunction of thoracic region: Secondary | ICD-10-CM | POA: Diagnosis not present

## 2021-07-27 DIAGNOSIS — M9903 Segmental and somatic dysfunction of lumbar region: Secondary | ICD-10-CM | POA: Diagnosis not present

## 2021-07-27 DIAGNOSIS — M9905 Segmental and somatic dysfunction of pelvic region: Secondary | ICD-10-CM | POA: Diagnosis not present

## 2021-07-27 DIAGNOSIS — M5451 Vertebrogenic low back pain: Secondary | ICD-10-CM | POA: Diagnosis not present

## 2021-07-31 DIAGNOSIS — M9905 Segmental and somatic dysfunction of pelvic region: Secondary | ICD-10-CM | POA: Diagnosis not present

## 2021-07-31 DIAGNOSIS — M9903 Segmental and somatic dysfunction of lumbar region: Secondary | ICD-10-CM | POA: Diagnosis not present

## 2021-07-31 DIAGNOSIS — M5451 Vertebrogenic low back pain: Secondary | ICD-10-CM | POA: Diagnosis not present

## 2021-07-31 DIAGNOSIS — M9902 Segmental and somatic dysfunction of thoracic region: Secondary | ICD-10-CM | POA: Diagnosis not present

## 2021-07-31 DIAGNOSIS — M461 Sacroiliitis, not elsewhere classified: Secondary | ICD-10-CM | POA: Diagnosis not present

## 2021-08-03 DIAGNOSIS — M9902 Segmental and somatic dysfunction of thoracic region: Secondary | ICD-10-CM | POA: Diagnosis not present

## 2021-08-03 DIAGNOSIS — M5451 Vertebrogenic low back pain: Secondary | ICD-10-CM | POA: Diagnosis not present

## 2021-08-03 DIAGNOSIS — M461 Sacroiliitis, not elsewhere classified: Secondary | ICD-10-CM | POA: Diagnosis not present

## 2021-08-03 DIAGNOSIS — M9905 Segmental and somatic dysfunction of pelvic region: Secondary | ICD-10-CM | POA: Diagnosis not present

## 2021-08-03 DIAGNOSIS — M9903 Segmental and somatic dysfunction of lumbar region: Secondary | ICD-10-CM | POA: Diagnosis not present

## 2021-08-06 DIAGNOSIS — M5451 Vertebrogenic low back pain: Secondary | ICD-10-CM | POA: Diagnosis not present

## 2021-08-06 DIAGNOSIS — M461 Sacroiliitis, not elsewhere classified: Secondary | ICD-10-CM | POA: Diagnosis not present

## 2021-08-06 DIAGNOSIS — M9902 Segmental and somatic dysfunction of thoracic region: Secondary | ICD-10-CM | POA: Diagnosis not present

## 2021-08-06 DIAGNOSIS — M9903 Segmental and somatic dysfunction of lumbar region: Secondary | ICD-10-CM | POA: Diagnosis not present

## 2021-08-06 DIAGNOSIS — M9905 Segmental and somatic dysfunction of pelvic region: Secondary | ICD-10-CM | POA: Diagnosis not present

## 2021-08-08 DIAGNOSIS — M5451 Vertebrogenic low back pain: Secondary | ICD-10-CM | POA: Diagnosis not present

## 2021-08-08 DIAGNOSIS — M461 Sacroiliitis, not elsewhere classified: Secondary | ICD-10-CM | POA: Diagnosis not present

## 2021-08-08 DIAGNOSIS — M9902 Segmental and somatic dysfunction of thoracic region: Secondary | ICD-10-CM | POA: Diagnosis not present

## 2021-08-08 DIAGNOSIS — M9903 Segmental and somatic dysfunction of lumbar region: Secondary | ICD-10-CM | POA: Diagnosis not present

## 2021-08-08 DIAGNOSIS — M9905 Segmental and somatic dysfunction of pelvic region: Secondary | ICD-10-CM | POA: Diagnosis not present

## 2021-08-13 ENCOUNTER — Other Ambulatory Visit: Payer: Self-pay | Admitting: Hematology and Oncology

## 2021-08-13 ENCOUNTER — Inpatient Hospital Stay: Payer: PPO | Attending: Oncology

## 2021-08-13 DIAGNOSIS — C9210 Chronic myeloid leukemia, BCR/ABL-positive, not having achieved remission: Secondary | ICD-10-CM

## 2021-08-13 DIAGNOSIS — D649 Anemia, unspecified: Secondary | ICD-10-CM | POA: Diagnosis not present

## 2021-08-13 LAB — CBC AND DIFFERENTIAL
HCT: 40 (ref 36–46)
Hemoglobin: 12.9 (ref 12.0–16.0)
Neutrophils Absolute: 2.3
Platelets: 188 (ref 150–399)
WBC: 4.1

## 2021-08-13 LAB — CBC
MCV: 92 (ref 81–99)
RBC: 4.28 (ref 3.87–5.11)

## 2021-08-13 NOTE — Progress Notes (Signed)
[facility]  [address] [place],  [place]  [address] [phone]  Clinic Day:  08/20/2021  Referring physician: Myrlene Broker, MD  This document serves as a record of services personally performed by Leidy Massar Macarthur Critchley, MD. It was created on their behalf by [facility] E, a trained medical scribe. The creation of this record is based on the scribe's personal observations and the provider's statements to them.  HISTORY OF PRESENT ILLNESS:  The patient is a 87 y.o. female with chronic myelogenous leukemia.  She has been taking dasatinib 40 mg daily to keep this disease under ideal control.  Previous quantitative bcr-abl tests have shown her to be in a complete molecular response.  She comes in today for routine follow-up.  Since her last visit, the patient has been doing okay.  She remains compliant with taking her dasatinib on a daily basis.  She denies having any B symptoms or other findings which concern her for chronic myelogenous leukemia losing sensitivity to her dasatinib therapy.   PHYSICAL EXAM:  Blood pressure (!) 182/95, pulse 85, temperature 98 F (36.7 C), resp. rate 14, height '5\' 3"'$  (1.6 m), weight 127 lb 8 oz (57.8 kg), SpO2 95 %. Wt Readings from Last 3 Encounters:  08/20/21 127 lb 8 oz (57.8 kg)  04/19/21 129 lb 14.4 oz (58.9 kg)  12/20/20 135 lb (61.2 kg)   Body mass index is 22.59 kg/m. Performance status (ECOG): 1 Physical Exam Constitutional:      Appearance: Normal appearance. She is not ill-appearing.  HENT:     Mouth/Throat:     Mouth: Mucous membranes are moist.     Pharynx: Oropharynx is clear. No oropharyngeal exudate or posterior oropharyngeal erythema.  Cardiovascular:     Rate and Rhythm: Normal rate and regular rhythm.     Heart sounds: No murmur heard.   No friction rub. No gallop.  Pulmonary:     Effort: Pulmonary effort is normal. No respiratory distress.     Breath sounds: Normal breath sounds. No  wheezing, rhonchi or rales.  Abdominal:     General: Bowel sounds are normal. There is no distension.     Palpations: Abdomen is soft. There is no mass.     Tenderness: There is no abdominal tenderness.  Musculoskeletal:        General: No swelling.     Right lower leg: No edema.     Left lower leg: No edema.  Lymphadenopathy:     Cervical: No cervical adenopathy.     Upper Body:     Right upper body: No supraclavicular or axillary adenopathy.     Left upper body: No supraclavicular or axillary adenopathy.     Lower Body: No right inguinal adenopathy. No left inguinal adenopathy.  Skin:    General: Skin is warm.     Coloration: Skin is not jaundiced.     Findings: No lesion or rash.  Neurological:     General: No focal deficit present.     Mental Status: She is alert and oriented to person, place, and time. Mental status is at baseline.     Cranial Nerves: Cranial nerves are intact.  Psychiatric:        Mood and Affect: Mood normal.        Behavior: Behavior normal.        Thought Content: Thought content normal.    LABS:   CBC Latest Ref Rng & Units 08/13/2021 05/11/2021 04/12/2021  WBC -  4.1 4.5 4.2  Hemoglobin 12.0 - 16.0 12.9 12.2 12.4  Hematocrit 36 - 46 40 37 39  Platelets 150 - 399 188 196 197   CMP Latest Ref Rng & Units 05/11/2021 12/13/2020  BUN 4 - '21 12 18  '$ Creatinine 0.5 - 1.1 0.6 0.7  Sodium 137 - 147 142 141  Potassium 3.4 - 5.3 3.7 3.5  Chloride 99 - 108 104 104  CO2 13 - 22 31(A) 28(A)  Calcium 8.7 - 10.7 9.0 9.2  Alkaline Phos 25 - 125 53 69  AST 13 - 35 27 29  ALT 7 - 35 8 8    ASSESSMENT & PLAN:  Assessment/Plan:  A 87 y.o. female with chronic myelogenous leukemia.  I remain pleased as this patient's peripheral counts reman ideal while on her dasatinib.  As the patient is clinically doing very well and is exhibiting no side effects from her dasatinib, she will continue to take dasatinib at 40 mg daily.  I will see her back in 4 months for repeat  clinical assessment. The patient understands all the plans discussed today and is in agreement with them.   I, Rita Ohara, am acting as scribe for Marice Potter, MD    I have reviewed this report as typed by the medical scribe, and it is complete and accurate.  Gradie Ohm Macarthur Critchley, MD

## 2021-08-17 DIAGNOSIS — M5451 Vertebrogenic low back pain: Secondary | ICD-10-CM | POA: Diagnosis not present

## 2021-08-17 DIAGNOSIS — M9903 Segmental and somatic dysfunction of lumbar region: Secondary | ICD-10-CM | POA: Diagnosis not present

## 2021-08-17 DIAGNOSIS — M9902 Segmental and somatic dysfunction of thoracic region: Secondary | ICD-10-CM | POA: Diagnosis not present

## 2021-08-17 DIAGNOSIS — M461 Sacroiliitis, not elsewhere classified: Secondary | ICD-10-CM | POA: Diagnosis not present

## 2021-08-17 DIAGNOSIS — M9905 Segmental and somatic dysfunction of pelvic region: Secondary | ICD-10-CM | POA: Diagnosis not present

## 2021-08-20 ENCOUNTER — Inpatient Hospital Stay (INDEPENDENT_AMBULATORY_CARE_PROVIDER_SITE_OTHER): Payer: PPO | Admitting: Oncology

## 2021-08-20 ENCOUNTER — Other Ambulatory Visit: Payer: Self-pay | Admitting: Oncology

## 2021-08-20 VITALS — BP 182/95 | HR 85 | Temp 98.0°F | Resp 14 | Ht 63.0 in | Wt 127.5 lb

## 2021-08-20 DIAGNOSIS — C9210 Chronic myeloid leukemia, BCR/ABL-positive, not having achieved remission: Secondary | ICD-10-CM

## 2021-08-20 DIAGNOSIS — M1990 Unspecified osteoarthritis, unspecified site: Secondary | ICD-10-CM | POA: Insufficient documentation

## 2021-08-20 DIAGNOSIS — I509 Heart failure, unspecified: Secondary | ICD-10-CM | POA: Insufficient documentation

## 2021-08-23 DIAGNOSIS — M461 Sacroiliitis, not elsewhere classified: Secondary | ICD-10-CM | POA: Diagnosis not present

## 2021-08-23 DIAGNOSIS — M9902 Segmental and somatic dysfunction of thoracic region: Secondary | ICD-10-CM | POA: Diagnosis not present

## 2021-08-23 DIAGNOSIS — M5451 Vertebrogenic low back pain: Secondary | ICD-10-CM | POA: Diagnosis not present

## 2021-08-23 DIAGNOSIS — M9903 Segmental and somatic dysfunction of lumbar region: Secondary | ICD-10-CM | POA: Diagnosis not present

## 2021-08-23 DIAGNOSIS — M9905 Segmental and somatic dysfunction of pelvic region: Secondary | ICD-10-CM | POA: Diagnosis not present

## 2021-08-29 DIAGNOSIS — M5451 Vertebrogenic low back pain: Secondary | ICD-10-CM | POA: Diagnosis not present

## 2021-08-29 DIAGNOSIS — M9902 Segmental and somatic dysfunction of thoracic region: Secondary | ICD-10-CM | POA: Diagnosis not present

## 2021-08-29 DIAGNOSIS — M9905 Segmental and somatic dysfunction of pelvic region: Secondary | ICD-10-CM | POA: Diagnosis not present

## 2021-08-29 DIAGNOSIS — M9903 Segmental and somatic dysfunction of lumbar region: Secondary | ICD-10-CM | POA: Diagnosis not present

## 2021-08-29 DIAGNOSIS — M461 Sacroiliitis, not elsewhere classified: Secondary | ICD-10-CM | POA: Diagnosis not present

## 2021-09-05 DIAGNOSIS — M5451 Vertebrogenic low back pain: Secondary | ICD-10-CM | POA: Diagnosis not present

## 2021-09-05 DIAGNOSIS — M461 Sacroiliitis, not elsewhere classified: Secondary | ICD-10-CM | POA: Diagnosis not present

## 2021-09-05 DIAGNOSIS — M9903 Segmental and somatic dysfunction of lumbar region: Secondary | ICD-10-CM | POA: Diagnosis not present

## 2021-09-05 DIAGNOSIS — M9905 Segmental and somatic dysfunction of pelvic region: Secondary | ICD-10-CM | POA: Diagnosis not present

## 2021-09-05 DIAGNOSIS — M9902 Segmental and somatic dysfunction of thoracic region: Secondary | ICD-10-CM | POA: Diagnosis not present

## 2021-09-06 DIAGNOSIS — Z950 Presence of cardiac pacemaker: Secondary | ICD-10-CM | POA: Diagnosis not present

## 2021-09-12 DIAGNOSIS — M5451 Vertebrogenic low back pain: Secondary | ICD-10-CM | POA: Diagnosis not present

## 2021-09-12 DIAGNOSIS — M461 Sacroiliitis, not elsewhere classified: Secondary | ICD-10-CM | POA: Diagnosis not present

## 2021-09-12 DIAGNOSIS — M9903 Segmental and somatic dysfunction of lumbar region: Secondary | ICD-10-CM | POA: Diagnosis not present

## 2021-09-12 DIAGNOSIS — M9905 Segmental and somatic dysfunction of pelvic region: Secondary | ICD-10-CM | POA: Diagnosis not present

## 2021-09-12 DIAGNOSIS — M9902 Segmental and somatic dysfunction of thoracic region: Secondary | ICD-10-CM | POA: Diagnosis not present

## 2021-09-19 DIAGNOSIS — M9905 Segmental and somatic dysfunction of pelvic region: Secondary | ICD-10-CM | POA: Diagnosis not present

## 2021-09-19 DIAGNOSIS — M9902 Segmental and somatic dysfunction of thoracic region: Secondary | ICD-10-CM | POA: Diagnosis not present

## 2021-09-19 DIAGNOSIS — M5451 Vertebrogenic low back pain: Secondary | ICD-10-CM | POA: Diagnosis not present

## 2021-09-19 DIAGNOSIS — M461 Sacroiliitis, not elsewhere classified: Secondary | ICD-10-CM | POA: Diagnosis not present

## 2021-09-19 DIAGNOSIS — M9903 Segmental and somatic dysfunction of lumbar region: Secondary | ICD-10-CM | POA: Diagnosis not present

## 2021-09-26 DIAGNOSIS — M9902 Segmental and somatic dysfunction of thoracic region: Secondary | ICD-10-CM | POA: Diagnosis not present

## 2021-09-26 DIAGNOSIS — M461 Sacroiliitis, not elsewhere classified: Secondary | ICD-10-CM | POA: Diagnosis not present

## 2021-09-26 DIAGNOSIS — M9905 Segmental and somatic dysfunction of pelvic region: Secondary | ICD-10-CM | POA: Diagnosis not present

## 2021-09-26 DIAGNOSIS — M5451 Vertebrogenic low back pain: Secondary | ICD-10-CM | POA: Diagnosis not present

## 2021-09-26 DIAGNOSIS — M5385 Other specified dorsopathies, thoracolumbar region: Secondary | ICD-10-CM | POA: Diagnosis not present

## 2021-09-26 DIAGNOSIS — M9903 Segmental and somatic dysfunction of lumbar region: Secondary | ICD-10-CM | POA: Diagnosis not present

## 2021-10-15 NOTE — Progress Notes (Signed)
Sent in re-enrollment application for patients Sprycel to [facility] [employer].

## 2021-10-22 DIAGNOSIS — M5451 Vertebrogenic low back pain: Secondary | ICD-10-CM | POA: Diagnosis not present

## 2021-10-22 DIAGNOSIS — M9902 Segmental and somatic dysfunction of thoracic region: Secondary | ICD-10-CM | POA: Diagnosis not present

## 2021-10-22 DIAGNOSIS — M9905 Segmental and somatic dysfunction of pelvic region: Secondary | ICD-10-CM | POA: Diagnosis not present

## 2021-10-22 DIAGNOSIS — M5385 Other specified dorsopathies, thoracolumbar region: Secondary | ICD-10-CM | POA: Diagnosis not present

## 2021-10-22 DIAGNOSIS — M9903 Segmental and somatic dysfunction of lumbar region: Secondary | ICD-10-CM | POA: Diagnosis not present

## 2021-10-22 DIAGNOSIS — M461 Sacroiliitis, not elsewhere classified: Secondary | ICD-10-CM | POA: Diagnosis not present

## 2021-10-31 DIAGNOSIS — M461 Sacroiliitis, not elsewhere classified: Secondary | ICD-10-CM | POA: Diagnosis not present

## 2021-10-31 DIAGNOSIS — M9903 Segmental and somatic dysfunction of lumbar region: Secondary | ICD-10-CM | POA: Diagnosis not present

## 2021-10-31 DIAGNOSIS — M9905 Segmental and somatic dysfunction of pelvic region: Secondary | ICD-10-CM | POA: Diagnosis not present

## 2021-10-31 DIAGNOSIS — M5385 Other specified dorsopathies, thoracolumbar region: Secondary | ICD-10-CM | POA: Diagnosis not present

## 2021-10-31 DIAGNOSIS — M5451 Vertebrogenic low back pain: Secondary | ICD-10-CM | POA: Diagnosis not present

## 2021-10-31 DIAGNOSIS — M9902 Segmental and somatic dysfunction of thoracic region: Secondary | ICD-10-CM | POA: Diagnosis not present

## 2021-11-07 DIAGNOSIS — M9903 Segmental and somatic dysfunction of lumbar region: Secondary | ICD-10-CM | POA: Diagnosis not present

## 2021-11-07 DIAGNOSIS — M5451 Vertebrogenic low back pain: Secondary | ICD-10-CM | POA: Diagnosis not present

## 2021-11-07 DIAGNOSIS — M9902 Segmental and somatic dysfunction of thoracic region: Secondary | ICD-10-CM | POA: Diagnosis not present

## 2021-11-07 DIAGNOSIS — M5385 Other specified dorsopathies, thoracolumbar region: Secondary | ICD-10-CM | POA: Diagnosis not present

## 2021-11-07 DIAGNOSIS — M9905 Segmental and somatic dysfunction of pelvic region: Secondary | ICD-10-CM | POA: Diagnosis not present

## 2021-11-07 DIAGNOSIS — M461 Sacroiliitis, not elsewhere classified: Secondary | ICD-10-CM | POA: Diagnosis not present

## 2021-11-12 DIAGNOSIS — F0393 Unspecified dementia, unspecified severity, with mood disturbance: Secondary | ICD-10-CM | POA: Diagnosis not present

## 2021-12-13 ENCOUNTER — Other Ambulatory Visit: Payer: PPO

## 2021-12-20 ENCOUNTER — Other Ambulatory Visit: Payer: Self-pay | Admitting: Hematology and Oncology

## 2021-12-20 ENCOUNTER — Inpatient Hospital Stay: Payer: PPO | Attending: Oncology

## 2021-12-20 ENCOUNTER — Other Ambulatory Visit: Payer: Self-pay

## 2021-12-20 ENCOUNTER — Ambulatory Visit: Payer: PPO | Admitting: Oncology

## 2021-12-20 DIAGNOSIS — C9220 Atypical chronic myeloid leukemia, BCR/ABL-negative, not having achieved remission: Secondary | ICD-10-CM | POA: Insufficient documentation

## 2021-12-20 DIAGNOSIS — C9210 Chronic myeloid leukemia, BCR/ABL-positive, not having achieved remission: Secondary | ICD-10-CM

## 2021-12-20 LAB — CBC AND DIFFERENTIAL
HCT: 38 (ref 36–46)
Hemoglobin: 12.1 (ref 12.0–16.0)
Neutrophils Absolute: 3.41
Platelets: 207 (ref 150–399)
WBC: 5.5

## 2021-12-20 LAB — CBC: RBC: 4.21 (ref 3.87–5.11)

## 2021-12-21 NOTE — Progress Notes (Signed)
[facility]  [address] [place],  [place]  [address] [phone]  Clinic Day:  12/27/2021  Referring physician: Myrlene Broker, MD  This document serves as a record of services personally performed by Frans Valente Macarthur Critchley, MD. It was created on their behalf by [facility] E, a trained medical scribe. The creation of this record is based on the scribe's personal observations and the provider's statements to them.  HISTORY OF PRESENT ILLNESS:  The patient is a 88 y.o. female with chronic myelogenous leukemia.  She has been taking dasatinib 40 mg daily to keep this disease under ideal control.  Previous quantitative bcr-abl tests have shown her to be in a complete molecular response.  She comes in today for routine follow-up.  Since her last visit, the patient has been doing okay.  She remains compliant with taking her dasatinib on a daily basis.  She denies having any B symptoms or other findings which concern her for chronic myelogenous leukemia losing sensitivity to her dasatinib therapy.   PHYSICAL EXAM:  Blood pressure (!) 170/74, pulse 87, temperature (!) 97.5 F (36.4 C), resp. rate 14, height 5\' 3"  (1.6 m), weight 129 lb (58.5 kg), SpO2 96 %. Wt Readings from Last 3 Encounters:  12/27/21 129 lb (58.5 kg)  08/20/21 127 lb 8 oz (57.8 kg)  04/19/21 129 lb 14.4 oz (58.9 kg)   Body mass index is 22.85 kg/m. Performance status (ECOG): 1 Physical Exam Constitutional:      Appearance: Normal appearance. She is not ill-appearing.  HENT:     Mouth/Throat:     Mouth: Mucous membranes are moist.     Pharynx: Oropharynx is clear. No oropharyngeal exudate or posterior oropharyngeal erythema.  Cardiovascular:     Rate and Rhythm: Normal rate and regular rhythm.     Heart sounds: No murmur heard.   No friction rub. No gallop.  Pulmonary:     Effort: Pulmonary effort is normal. No respiratory distress.     Breath sounds: Normal breath sounds. No  wheezing, rhonchi or rales.  Abdominal:     General: Bowel sounds are normal. There is no distension.     Palpations: Abdomen is soft. There is no mass.     Tenderness: There is no abdominal tenderness.  Musculoskeletal:        General: No swelling.     Right lower leg: No edema.     Left lower leg: No edema.  Lymphadenopathy:     Cervical: No cervical adenopathy.     Upper Body:     Right upper body: No supraclavicular or axillary adenopathy.     Left upper body: No supraclavicular or axillary adenopathy.     Lower Body: No right inguinal adenopathy. No left inguinal adenopathy.  Skin:    General: Skin is warm.     Coloration: Skin is not jaundiced.     Findings: No lesion or rash.  Neurological:     General: No focal deficit present.     Mental Status: She is alert and oriented to person, place, and time. Mental status is at baseline.  Psychiatric:        Mood and Affect: Mood normal.        Behavior: Behavior normal.        Thought Content: Thought content normal.    LABS:   CBC Latest Ref Rng & Units 12/20/2021 08/13/2021 05/11/2021  WBC - 5.5 4.1 4.5  Hemoglobin 12.0 - 16.0 12.1 12.9  12.2  Hematocrit 36 - 46 38 40 37  Platelets 150 - 399 207 188 196   CMP Latest Ref Rng & Units 05/11/2021 12/13/2020  BUN 4 - 21 12 18   Creatinine 0.5 - 1.1 0.6 0.7  Sodium 137 - 147 142 141  Potassium 3.4 - 5.3 3.7 3.5  Chloride 99 - 108 104 104  CO2 13 - 22 31(A) 28(A)  Calcium 8.7 - 10.7 9.0 9.2  Alkaline Phos 25 - 125 53 69  AST 13 - 35 27 29  ALT 7 - 35 8 8     ASSESSMENT & PLAN:  Assessment/Plan:  A 88 y.o. female with chronic myelogenous leukemia.  I remain pleased as this patient's peripheral counts reman ideal while on her dasatinib.  As the patient is clinically doing very well and is exhibiting no side effects from her dasatinib, she will continue to take dasatinib at 40 mg daily.  I will see her back in 4 months for repeat clinical assessment. The patient understands all  the plans discussed today and is in agreement with them.   I, Joanna Herrera, am acting as scribe for Joanna Potter, MD    I have reviewed this report as typed by the medical scribe, and it is complete and accurate.  Joanna Bogdanski Macarthur Critchley, MD

## 2021-12-24 ENCOUNTER — Encounter: Payer: Self-pay | Admitting: Oncology

## 2021-12-24 DIAGNOSIS — C9220 Atypical chronic myeloid leukemia, BCR/ABL-negative, not having achieved remission: Secondary | ICD-10-CM | POA: Diagnosis present

## 2021-12-24 NOTE — Progress Notes (Signed)
Patient was approved for free Sprycel through [employer].  PAT - [id number] 12/24/2021 - 11/24/2022

## 2021-12-27 ENCOUNTER — Telehealth: Payer: Self-pay | Admitting: Oncology

## 2021-12-27 ENCOUNTER — Inpatient Hospital Stay: Payer: PPO | Attending: Oncology | Admitting: Oncology

## 2021-12-27 ENCOUNTER — Other Ambulatory Visit: Payer: Self-pay | Admitting: Oncology

## 2021-12-27 ENCOUNTER — Other Ambulatory Visit: Payer: Self-pay

## 2021-12-27 VITALS — BP 170/74 | HR 87 | Temp 97.5°F | Resp 14 | Ht 63.0 in | Wt 129.0 lb

## 2021-12-27 DIAGNOSIS — C9210 Chronic myeloid leukemia, BCR/ABL-positive, not having achieved remission: Secondary | ICD-10-CM

## 2021-12-27 NOTE — Telephone Encounter (Signed)
Per 12/27/21 los next appt scheduled and confirmed with patient °

## 2021-12-31 LAB — BCR-ABL1, CML/ALL, PCR, QUANT: Interpretation (BCRAL):: NEGATIVE

## 2022-02-13 DIAGNOSIS — I361 Nonrheumatic tricuspid (valve) insufficiency: Secondary | ICD-10-CM

## 2022-02-13 DIAGNOSIS — I340 Nonrheumatic mitral (valve) insufficiency: Secondary | ICD-10-CM | POA: Diagnosis not present

## 2022-02-20 NOTE — Progress Notes (Signed)
?[facility]  ?[address] ?[place],  [place]  [address] ?[phone] ? ?Clinic Day:  02/21/2022 ? ?Referring physician: Myrlene Broker, MD ? ?HISTORY OF PRESENT ILLNESS:  ?The patient is a 88 y.o. female with chronic myelogenous leukemia.  She has been taking dasatinib 40 mg daily to keep this disease under ideal control.  Previous quantitative bcr-abl tests have shown her to be in a complete molecular response.  She comes in today after being hospitalized for bilateral pleural effusions.  A thoracentesis of 1.2L of fluid did not reveal malignant cells.  An echocardiogram showed a depressed ejection fraction of 35-40% with mitral regurgitation and a severely dilated left atrium.  Since being discharged from the hospital, the patient has been doing okay.  She still gets occasionally short of breath after walking long distances. ? ?PHYSICAL EXAM:  ?Blood pressure (!) 146/79, pulse 87, temperature 97.6 ?F (36.4 ?C), resp. rate 16, height '5\' 3"'$  (1.6 m), weight 128 lb (58.1 kg), SpO2 97 %. ?Wt Readings from Last 3 Encounters:  ?02/21/22 128 lb (58.1 kg)  ?12/27/21 129 lb (58.5 kg)  ?08/20/21 127 lb 8 oz (57.8 kg)  ? ?Body mass index is 22.67 kg/m?Marland Kitchen ?Performance status (ECOG): 1 ?Physical Exam ?Constitutional:   ?   Appearance: Normal appearance. She is not ill-appearing.  ?HENT:  ?   Mouth/Throat:  ?   Mouth: Mucous membranes are moist.  ?   Pharynx: Oropharynx is clear. No oropharyngeal exudate or posterior oropharyngeal erythema.  ?Cardiovascular:  ?   Rate and Rhythm: Normal rate and regular rhythm.  ?   Heart sounds: No murmur heard. ?  No friction rub. No gallop.  ?Pulmonary:  ?   Effort: Pulmonary effort is normal. No respiratory distress.  ?   Breath sounds: Normal breath sounds. No wheezing, rhonchi or rales.  ?Abdominal:  ?   General: Bowel sounds are normal. There is no distension.  ?   Palpations: Abdomen is soft. There is no mass.  ?   Tenderness: There is no abdominal  tenderness.  ?Musculoskeletal:     ?   General: No swelling.  ?   Right lower leg: No edema.  ?   Left lower leg: No edema.  ?Lymphadenopathy:  ?   Cervical: No cervical adenopathy.  ?   Upper Body:  ?   Right upper body: No supraclavicular or axillary adenopathy.  ?   Left upper body: No supraclavicular or axillary adenopathy.  ?   Lower Body: No right inguinal adenopathy. No left inguinal adenopathy.  ?Skin: ?   General: Skin is warm.  ?   Coloration: Skin is not jaundiced.  ?   Findings: No lesion or rash.  ?Neurological:  ?   General: No focal deficit present.  ?   Mental Status: She is alert and oriented to person, place, and time. Mental status is at baseline.  ?Psychiatric:     ?   Mood and Affect: Mood normal.     ?   Behavior: Behavior normal.     ?   Thought Content: Thought content normal.  ? ? ?LABS:  ? ? ?  Latest Ref Rng & Units 12/20/2021  ? 12:00 AM 08/13/2021  ? 12:00 AM 05/11/2021  ? 12:00 AM  ?CBC  ?WBC  5.5      4.1      4.5    ?Hemoglobin 12.0 - 16.0 12.1      12.9  12.2    ?Hematocrit 36 - 46 38      40      37    ?Platelets 150 - 399 207      188      196    ?  ? This result is from an external source.  ? ? ?  Latest Ref Rng & Units 05/11/2021  ? 12:00 AM 12/13/2020  ? 12:00 AM  ?CMP  ?BUN 4 - '21 12   18       '$ ?Creatinine 0.5 - 1.1 0.6   0.7       ?Sodium 137 - 147 142   141       ?Potassium 3.4 - 5.3 3.7   3.5       ?Chloride 99 - 108 104   104       ?CO2 13 - '22 31   28       '$ ?Calcium 8.7 - 10.7 9.0   9.2       ?Alkaline Phos 25 - 125 53   69       ?AST 13 - 35 27   29       ?ALT 7 - 35 8   8       ?  ? This result is from an external source.  ? ? ? ?ASSESSMENT & PLAN:  ?Assessment/Plan:  An 88 y.o. female with chronic myelogenous leukemia.  Although her disease has been under control with Dasatinib, her recent bilateral pleural effusions in the hospital concerning for side effect related to this medicine.  Based upon this, Dasatinib will be discontinued.  I will switch her to imatinib 300 mg  daily with the hopes that this medicine will also lead to a complete molecular response as a pertains to her underlying CML. I will see her back in 6 weeks to see how well she is tolerating her [place] therapy.  The patient understands all the plans discussed today and is in agreement with them. ? ?Takeshi Teasdale Macarthur Critchley, MD ?   ? ? ?  ?

## 2022-02-21 ENCOUNTER — Other Ambulatory Visit (HOSPITAL_COMMUNITY): Payer: Self-pay

## 2022-02-21 ENCOUNTER — Other Ambulatory Visit: Payer: Self-pay | Admitting: Oncology

## 2022-02-21 ENCOUNTER — Telehealth: Payer: Self-pay | Admitting: Pharmacy Technician

## 2022-02-21 ENCOUNTER — Telehealth: Payer: Self-pay

## 2022-02-21 ENCOUNTER — Inpatient Hospital Stay: Payer: PPO | Attending: Oncology | Admitting: Oncology

## 2022-02-21 VITALS — BP 146/79 | HR 87 | Temp 97.6°F | Resp 16 | Ht 63.0 in | Wt 128.0 lb

## 2022-02-21 DIAGNOSIS — C9210 Chronic myeloid leukemia, BCR/ABL-positive, not having achieved remission: Secondary | ICD-10-CM | POA: Diagnosis not present

## 2022-02-21 MED ORDER — IMATINIB MESYLATE 100 MG PO TABS
300.0000 mg | ORAL_TABLET | Freq: Every day | ORAL | 11 refills | Status: DC
  Filled 2022-02-21: qty 90, 30d supply, fill #0

## 2022-02-21 NOTE — Telephone Encounter (Signed)
Received New start notification for  Gleevac . Will update as we work through the benefits process.  ?

## 2022-02-21 NOTE — Telephone Encounter (Signed)
Oral Oncology Pharmacist Encounter ? ?Received new prescription for imatinib (Gleevec) for the treatment of CML, planned duration until disease progression or unacceptable toxicity. Prescription dose and frequency assessed. ? ?Labs from 05/11/21 assessed, no interventions needed. ? ?Current medication list in Epic reviewed, DDIs with Gleevec identified: ?Levothyroxine: will need to monitor thyroid levels due to potential for decreased concentration by imatinib ? ?Evaluated chart and no patient barriers to medication adherence noted.  ? ?Patient agreement for treatment documented in MD note on 02/11/2022. ? ?Prescription has been e-scribed to the [facility] for benefits analysis and approval. ? ?Oral Oncology Clinic will continue to follow for insurance authorization, copayment issues, initial counseling and start date. ? ?Drema Halon, PharmD ?Hematology/Oncology Clinical Pharmacist ?[facility] ?[phone] ?02/21/2022 4:19 PM ? ? ?

## 2022-02-22 ENCOUNTER — Other Ambulatory Visit (HOSPITAL_COMMUNITY): Payer: Self-pay

## 2022-02-22 NOTE — Telephone Encounter (Signed)
Received notification from  [place]  regarding a prior authorization for  Gleevac/ Imatinib '100mg'$  . Authorization has been APPROVED from 02/22/22 to 02/22/23.  ? ?Per test claim, copay for 30 days supply is $125.00 ? ?Authorization # Key: [id number] ? ?Will look into patient assistance options. ? ? ? ?

## 2022-02-22 NOTE — Telephone Encounter (Signed)
Called and spoke to Daughter, Joanna Herrera, we discussed benefits investigation. No grants currently open and no PAP due to medication being generic. Discussed filling through [facility]- mail order. They quote no more than $40/month copay for 90 tablets. Sent email to daughter- [email or web address]. She will try to create an account on their website this weekend. ? ?I will follow up with her on Monday, of she has not had a chance to create the account, we will setup 1 shipment from [facility] to prevent any delays. ?

## 2022-02-25 NOTE — Telephone Encounter (Signed)
LVM for Joanna Herrera to follow up ?

## 2022-02-26 MED ORDER — IMATINIB MESYLATE 100 MG PO TABS: 300.0000 mg | ORAL_TABLET | Freq: Every day | ORAL | 11 refills | Status: AC

## 2022-02-26 NOTE — Telephone Encounter (Signed)
Received call from [name] that patient's [facility] account was created- with email: [email or web address]. Sent message to Rph to send in her prescription. ?

## 2022-02-26 NOTE — Telephone Encounter (Signed)
Called and spoke to Joanna Herrera, daughter, she will do pharmacy enrollment today and will call me back to advise of completion, so we can send the prescription in. ?

## 2022-02-27 NOTE — Telephone Encounter (Signed)
Rx sent in on 4/4. LVM for patient's daughter to see if they have received a call yet. ?

## 2022-03-01 NOTE — Telephone Encounter (Signed)
Spoke to patient's Daughter in [place], she received notification that medication shipped out on 4/6. ?

## 2022-03-01 NOTE — Telephone Encounter (Signed)
Oral Chemotherapy Pharmacist Encounter ? ?I spoke with patient for overview of Gleevec (imatinib) for the treatment of CML, planned duration until disease progression or unacceptable toxicity.  ? ?Counseled patient on administration, dosing, side effects, monitoring, drug-food interactions, safe handling, storage, and disposal. ? ?Patient will take Gleevec '100mg'$  tablets, 3 tablets ('300mg'$ ) by mouth once daily with a meal and a large glass of water. ? ?Patient knows food may decrease stomach irritation and to maintain hydration while on treatment with Gleevec. ? ?Patient counseled to avoid grapefruit and grapefruit juice. ? ?Gleevec start date: when patient receives. It is in route to patients home and should receive from [facility] in the next week. ?Patient knows to call me to if she does not receive. ? ?Adverse effects include but are not limited to: nausea, vomiting, diarrhea, fatigue, muscle cramps, lower extremity edema, rash, decreased blood counts, GI bleeding, and cardiac dysfunction. ? ?Patient has anti-emetic on hand and knows to take it if nausea develops.   ?Patient will obtain anti diarrheal and alert the office of 4 or more loose stools above baseline. ? ?Reviewed with patient importance of keeping a medication schedule and plan for any missed doses. No barriers to medication adherence identified. ? ?Medication reconciliation performed and medication/allergy list updated. ? ?Insurance authorization for [facility] has been obtained. ?Patient will receive medication through [facility] for $40.  ? ?Patient informed the pharmacy will reach out 5-7 days prior to needing next fill of Gleevec to coordinate continued medication acquisition to prevent break in therapy. ? ?All questions answered. ? ?Mrs. Nigh voiced understanding and appreciation.  ? ?Medication education handout placed in mail for patient. Patient knows to call the office with questions or concerns. Oral Chemotherapy  Clinic phone number provided to patient.  ? ?Drema Halon, PharmD ?Hematology/Oncology Clinical Pharmacist ?[facility] ?[phone] ?03/01/2022   11:03 AM ? ? ?

## 2022-03-07 ENCOUNTER — Telehealth: Payer: Self-pay

## 2022-03-07 NOTE — Telephone Encounter (Signed)
I spoke with pt's daughter in law, Cleda Mccreedy. She reports that Mrs Guajardo started the [place] on 03/04/22. Pt is taking the [place] with her evening meal. Pt is having diarrhea today, has been multiple times per pt's sitter. I educated [name] about using Imodium up to 8 tabs/day to control the diarrhea. I also told her that it is important to have her drink a cup of fluid for each loose BM to prevent dehydration. [name] states they have a hard time getting her to drink water. I told her pt can drink any type of fluid, including Gatorade or even popsicles and jello.  Pt has not had any N/V, fevers, muscle cramps, headaches, cough, SOB, nor rashes/itching. I reminded [name] to call us if pt develops fever or any problems. She verbalized understanding. Appt made for 2 wk f/u visit. ?

## 2022-03-11 ENCOUNTER — Telehealth: Payer: Self-pay

## 2022-03-11 NOTE — Telephone Encounter (Addendum)
I spoke with pt's daughter in law, Joanna Herrera. She states Joanna Herrera is doing better. She hasn't needed the Imodium since they picked it up at store. The family are discussing placing her in assisted living. Pt is not happy about that at all.  ?

## 2022-03-15 ENCOUNTER — Other Ambulatory Visit: Payer: Self-pay | Admitting: Hematology and Oncology

## 2022-03-15 ENCOUNTER — Inpatient Hospital Stay: Payer: PPO | Attending: Oncology | Admitting: Hematology and Oncology

## 2022-03-15 ENCOUNTER — Other Ambulatory Visit: Payer: Self-pay

## 2022-03-15 ENCOUNTER — Inpatient Hospital Stay: Payer: PPO

## 2022-03-15 ENCOUNTER — Encounter: Payer: Self-pay | Admitting: Hematology and Oncology

## 2022-03-15 DIAGNOSIS — C9210 Chronic myeloid leukemia, BCR/ABL-positive, not having achieved remission: Secondary | ICD-10-CM | POA: Diagnosis not present

## 2022-03-15 LAB — CBC AND DIFFERENTIAL
HCT: 42 (ref 36–46)
Hemoglobin: 12.8 (ref 12.0–16.0)
Neutrophils Absolute: 4.28
Platelets: 180 10*3/uL (ref 150–400)
WBC: 6.3

## 2022-03-15 LAB — COMPREHENSIVE METABOLIC PANEL
Albumin: 4.1 (ref 3.5–5.0)
Calcium: 8.7 (ref 8.7–10.7)

## 2022-03-15 LAB — BASIC METABOLIC PANEL
BUN: 15 (ref 4–21)
CO2: 29 — AB (ref 13–22)
Chloride: 103 (ref 99–108)
Creatinine: 0.8 (ref 0.5–1.1)
Glucose: 123
Potassium: 3.9 mEq/L (ref 3.5–5.1)
Sodium: 141 (ref 137–147)

## 2022-03-15 LAB — HEPATIC FUNCTION PANEL
ALT: 9 U/L (ref 7–35)
AST: 34 (ref 13–35)
Alkaline Phosphatase: 58 (ref 25–125)
Bilirubin, Total: 0.8

## 2022-03-15 LAB — CBC: RBC: 4.67 (ref 3.87–5.11)

## 2022-03-15 NOTE — Assessment & Plan Note (Addendum)
An 88 y.o. female with chronic myelogenous leukemia.  Although her disease has been under control with Dasatinib, her recent bilateral pleural effusions in the hospital concerning for side effect related to this medicine.  Based upon this, Dasatinib will be discontinued. She was switched  to imatinib 300 mg daily with the hopes that this medicine will also lead to a complete molecular response as a pertains to her underlying CML. She appears to be tolerating meds well. CBC and CMP are unremarkable. She will keep scheduled follow up with Dr. Bobby Rumpf. ?

## 2022-03-15 NOTE — Progress Notes (Cosign Needed)
?Patient Care Team: ?Myrlene Broker, MD as PCP - General (Family Medicine) ?Marice Potter, MD as Consulting Physician (Oncology) ? ?Clinic Day:  03/15/2022 ? ?Referring physician: Myrlene Broker, MD ? ?ASSESSMENT & PLAN:  ? ?Assessment & Plan: ?Chronic myelogenous leukemia ([place]) ?An 88 y.o. female with chronic myelogenous leukemia.  Although her disease has been under control with Dasatinib, her recent bilateral pleural effusions in the hospital concerning for side effect related to this medicine.  Based upon this, Dasatinib will be discontinued. She was switched  to imatinib 300 mg daily with the hopes that this medicine will also lead to a complete molecular response as a pertains to her underlying CML. She appears to be tolerating meds well. CBC and CMP are unremarkable. She will keep scheduled follow up with Dr. Bobby Rumpf. ?  ? ?The patient understands the plans discussed today and is in agreement with them.  She knows to contact our office if she develops concerns prior to her next appointment. ? ? ? ?Melodye Ped, NP  ?[facility] ?[place] ?[place] [address] ?Dept: [phone] ?Dept Fax: [phone]  ? ?Orders Placed This Encounter  ?Procedures  ? CBC and differential  ?  This external order was created through the Results Console.  ? CBC  ?  This external order was created through the Results Console.  ? Basic metabolic panel  ?  This external order was created through the Results Console.  ? Comprehensive metabolic panel  ?  This external order was created through the Results Console.  ? Hepatic function panel  ?  This external order was created through the Results Console.  ?  ? ? ?CHIEF COMPLAINT:  ?CC: An 88 year old female with history of CML here for 2 week evaluation after starting [place] ? ?Current Treatment:  Gleevec ? ?INTERVAL HISTORY:  ?Ari is here today for repeat clinical assessment. She denies fevers or  chills. She denies pain. Her appetite is good. Her weight has been stable. ? ?I have reviewed the past medical history, past surgical history, social history and family history with the patient and they are unchanged from previous note. ? ?ALLERGIES:  has No Known Allergies. ? ?MEDICATIONS:  ?Current Outpatient Medications  ?Medication Sig Dispense Refill  ? mirtazapine (REMERON) 15 MG tablet Take 1 tablet by mouth at bedtime.    ? acetaminophen (TYLENOL) 500 MG tablet Take 500 mg by mouth as needed.     ? acetaminophen (TYLENOL) 500 MG tablet Take by mouth.    ? aspirin 81 MG tablet Take 81 mg by mouth daily.    ? baclofen (LIORESAL) 10 MG tablet 1 tablet as needed.    ? chlorzoxazone (PARAFON) 500 MG tablet Take 500 mg by mouth 3 (three) times daily as needed.    ? dasatinib (SPRYCEL) 80 MG tablet Take by mouth.    ? donepezil (ARICEPT) 10 MG tablet Take 1 tablet (10 mg total) by mouth at bedtime. 90 tablet 4  ? DULoxetine (CYMBALTA) 60 MG capsule Take 1 capsule by mouth daily.    ? furosemide (LASIX) 20 MG tablet Take 20 mg by mouth.    ? imatinib (GLEEVEC) 100 MG tablet Take 3 tablets (300 mg total) by mouth daily. Take with meals and large glass of water.Caution:Chemotherapy 90 tablet 11  ? levothyroxine (SYNTHROID, LEVOTHROID) 75 MCG tablet Take 75 mcg by mouth daily with breakfast.     ? lisinopril (PRINIVIL,ZESTRIL) 2.5  MG tablet Take 1 tablet by mouth daily.    ? loperamide (IMODIUM) 2 MG capsule Take 1 capsule by mouth as needed for diarrhea or loose stools. Pt to take 2 capsules after 1st loose stool each day, then 1 cap thereafter up to 8 caps per day.    ? LORazepam (ATIVAN) 0.5 MG tablet Take 0.5 mg by mouth at bedtime.    ? meloxicam (MOBIC) 7.5 MG tablet Take 7.5 mg by mouth daily.    ? memantine (NAMENDA) 10 MG tablet Take 1 tablet (10 mg total) by mouth 2 (two) times daily. 180 tablet 4  ? metoprolol succinate (TOPROL-XL) 25 MG 24 hr tablet Take 25 mg by mouth daily.    ? mirtazapine (REMERON) 15  MG tablet Take by mouth.    ? potassium chloride (KLOR-CON) 10 MEQ tablet Take 1 tablet by mouth daily.    ? ?No current facility-administered medications for this visit.  ? ? ?HISTORY OF PRESENT ILLNESS:  ? ?Oncology History  ? No history exists.  ?  ? ? ?REVIEW OF SYSTEMS:  ? ?Constitutional: Denies fevers, chills or abnormal weight loss ?Eyes: Denies blurriness of vision ?Ears, nose, mouth, throat, and face: Denies mucositis or sore throat ?Respiratory: Denies cough, dyspnea or wheezes ?Cardiovascular: Denies palpitation, chest discomfort or lower extremity swelling ?Gastrointestinal:  Denies nausea, heartburn or change in bowel habits ?Skin: Denies abnormal skin rashes ?Lymphatics: Denies new lymphadenopathy or easy bruising ?Neurological:Denies numbness, tingling or new weaknesses ?Behavioral/Psych: Mood is stable, no new changes  ?All other systems were reviewed with the patient and are negative. ? ? ?VITALS:  ?Blood pressure (!) 175/77, pulse 74, temperature (!) 97.5 ?F (36.4 ?C), temperature source Oral, resp. rate 20, height '5\' 3"'$  (1.6 m), weight 132 lb (59.9 kg), SpO2 97 %.  ?Wt Readings from Last 3 Encounters:  ?03/15/22 132 lb (59.9 kg)  ?02/21/22 128 lb (58.1 kg)  ?12/27/21 129 lb (58.5 kg)  ?  ?Body mass index is 23.38 kg/m?. ? ?Performance status (ECOG): 1 - Symptomatic but completely ambulatory ? ?PHYSICAL EXAM:  ? ?GENERAL:alert, no distress and comfortable ?SKIN: skin color, texture, turgor are normal, no rashes or significant lesions ?EYES: normal, Conjunctiva are pink and non-injected, sclera clear ?OROPHARYNX:no exudate, no erythema and lips, buccal mucosa, and tongue normal  ?NECK: supple, thyroid normal size, non-tender, without nodularity ?LYMPH:  no palpable lymphadenopathy in the cervical, axillary or inguinal ?LUNGS: clear to auscultation and percussion with normal breathing effort ?HEART: regular rate & rhythm and no murmurs and no lower extremity edema ?ABDOMEN:abdomen soft, non-tender  and normal bowel sounds ?Musculoskeletal:no cyanosis of digits and no clubbing  ?NEURO: alert & oriented x 3 with fluent speech, no focal motor/sensory deficits ? ?LABORATORY DATA:  ?I have reviewed the data as listed ?   ?Component Value Date/Time  ? NA 141 03/15/2022 0000  ? K 3.9 03/15/2022 0000  ? CL 103 03/15/2022 0000  ? CO2 29 (A) 03/15/2022 0000  ? BUN 15 03/15/2022 0000  ? CREATININE 0.8 03/15/2022 0000  ? CALCIUM 8.7 03/15/2022 0000  ? ALBUMIN 4.1 03/15/2022 0000  ? AST 34 03/15/2022 0000  ? ALT 9 03/15/2022 0000  ? ALKPHOS 58 03/15/2022 0000  ? ? ?No results found for: SPEP, UPEP ? ?Lab Results  ?Component Value Date  ? WBC 6.3 03/15/2022  ? NEUTROABS 4.28 03/15/2022  ? HGB 12.8 03/15/2022  ? HCT 42 03/15/2022  ? MCV 92 08/13/2021  ? PLT 180 03/15/2022  ? ? ?  Chemistry   ?   ?Component Value Date/Time  ? NA 141 03/15/2022 0000  ? K 3.9 03/15/2022 0000  ? CL 103 03/15/2022 0000  ? CO2 29 (A) 03/15/2022 0000  ? BUN 15 03/15/2022 0000  ? CREATININE 0.8 03/15/2022 0000  ? GLU 123 03/15/2022 0000  ?    ?Component Value Date/Time  ? CALCIUM 8.7 03/15/2022 0000  ? ALKPHOS 58 03/15/2022 0000  ? AST 34 03/15/2022 0000  ? ALT 9 03/15/2022 0000  ?  ? ? ? ?RADIOGRAPHIC STUDIES: ?I have personally reviewed the radiological images as listed and agreed with the findings in the report. ?No results found. ?

## 2022-03-19 ENCOUNTER — Telehealth: Payer: Self-pay

## 2022-03-19 NOTE — Telephone Encounter (Addendum)
I spoke with Joanna Herrera. She states Joanna Herrera is doing pretty good. The diarrhea has stopped. Pt c/o intermittent nausea, but no emesis. Pt has not reported any muscle cramps, lower ext edema, or rash. They still plan to place Joanna Herrera in an assisted living facility. [name] is coming around to the idea better. I confirmed next appt for 04/03/2022. ?

## 2022-03-25 ENCOUNTER — Institutional Professional Consult (permissible substitution): Payer: PRIVATE HEALTH INSURANCE | Admitting: Neurology

## 2022-03-29 ENCOUNTER — Encounter: Payer: Self-pay | Admitting: Oncology

## 2022-04-03 ENCOUNTER — Telehealth: Payer: Self-pay | Admitting: Hematology and Oncology

## 2022-04-03 ENCOUNTER — Other Ambulatory Visit: Payer: Self-pay | Admitting: Hematology and Oncology

## 2022-04-03 ENCOUNTER — Inpatient Hospital Stay: Payer: PPO | Attending: Oncology

## 2022-04-03 DIAGNOSIS — C9220 Atypical chronic myeloid leukemia, BCR/ABL-negative, not having achieved remission: Secondary | ICD-10-CM | POA: Diagnosis present

## 2022-04-03 DIAGNOSIS — C9210 Chronic myeloid leukemia, BCR/ABL-positive, not having achieved remission: Secondary | ICD-10-CM

## 2022-04-03 DIAGNOSIS — J90 Pleural effusion, not elsewhere classified: Secondary | ICD-10-CM | POA: Insufficient documentation

## 2022-04-03 LAB — CBC AND DIFFERENTIAL
HCT: 40 (ref 36–46)
Hemoglobin: 12.6 (ref 12.0–16.0)
Neutrophils Absolute: 2.77
Platelets: 162 10*3/uL (ref 150–400)
WBC: 4.9

## 2022-04-03 LAB — CBC: RBC: 4.33 (ref 3.87–5.11)

## 2022-04-03 NOTE — Telephone Encounter (Signed)
04/03/22 Spoke with daughter in-law and gave appt for thoracentesis 04/05/22'@10am'$ . ?

## 2022-04-05 ENCOUNTER — Encounter: Payer: Self-pay | Admitting: Hematology and Oncology

## 2022-04-09 ENCOUNTER — Telehealth: Payer: Self-pay

## 2022-04-09 NOTE — Telephone Encounter (Signed)
I spoke with pt's daughter in law, Cleda Mccreedy. She states Laylana is gone to [employer] with some of her friends. Aolanis's friend, Rod Holler, called and told [name] that pt was eating well. They are coming home on Wednesday afternoon. Pt is scheduled to have pacemaker replaced on 04/25/2022. I will notify providers. ?

## 2022-04-10 ENCOUNTER — Ambulatory Visit: Payer: PPO | Admitting: Oncology

## 2022-04-11 LAB — BCR-ABL1, CML/ALL, PCR, QUANT: Interpretation (BCRAL):: NEGATIVE

## 2022-04-15 NOTE — Progress Notes (Signed)
[facility]  [address] [place],  [place]  [address] [phone]  Clinic Day:  04/16/2022  Referring physician: Myrlene Broker, MD  HISTORY OF PRESENT ILLNESS:  The patient is a 88 y.o. female with chronic myelogenous leukemia.  She is currently taking imatinib 300 mg twice daily.  She was switched from dasatinib 40 mg after she developed pleural effusions.  Although it was presumed the pleural effusions were due to Dasatinib, the groin believe now that she likely had issues with congestive heart failure.  Nevertheless, the patient has tolerated her imatinib therapy fairly well.  She denies having any new symptoms or findings which concern her for either complications related to her CML.     PHYSICAL EXAM:  Blood pressure (!) 177/79, pulse 77, temperature 98.1 F (36.7 C), resp. rate 16, height '5\' 3"'$  (1.6 m), weight 131 lb 4.8 oz (59.6 kg), SpO2 92 %. Wt Readings from Last 3 Encounters:  04/16/22 131 lb 4.8 oz (59.6 kg)  03/15/22 132 lb (59.9 kg)  02/21/22 128 lb (58.1 kg)   Body mass index is 23.26 kg/m. Performance status (ECOG): 1 Physical Exam Constitutional:      Appearance: Normal appearance. She is not ill-appearing.  HENT:     Mouth/Throat:     Mouth: Mucous membranes are moist.     Pharynx: Oropharynx is clear. No oropharyngeal exudate or posterior oropharyngeal erythema.  Cardiovascular:     Rate and Rhythm: Normal rate and regular rhythm.     Heart sounds: No murmur heard.   No friction rub. No gallop.  Pulmonary:     Effort: Pulmonary effort is normal. No respiratory distress.     Breath sounds: Normal breath sounds. No wheezing, rhonchi or rales.  Abdominal:     General: Bowel sounds are normal. There is no distension.     Palpations: Abdomen is soft. There is no mass.     Tenderness: There is no abdominal tenderness.  Musculoskeletal:        General: No swelling.     Right lower leg: No edema.     Left lower leg: No  edema.  Lymphadenopathy:     Cervical: No cervical adenopathy.     Upper Body:     Right upper body: No supraclavicular or axillary adenopathy.     Left upper body: No supraclavicular or axillary adenopathy.     Lower Body: No right inguinal adenopathy. No left inguinal adenopathy.  Skin:    General: Skin is warm.     Coloration: Skin is not jaundiced.     Findings: No lesion or rash.  Neurological:     General: No focal deficit present.     Mental Status: She is alert and oriented to person, place, and time. Mental status is at baseline.  Psychiatric:        Mood and Affect: Mood normal.        Behavior: Behavior normal.        Thought Content: Thought content normal.    LABS:      Latest Ref Rng & Units 04/03/2022   12:00 AM 03/15/2022   12:00 AM 12/20/2021   12:00 AM  CBC  WBC  4.9      6.3      5.5       Hemoglobin 12.0 - 16.0 12.6      12.8      12.1       Hematocrit 36 - 46 40  42      38       Platelets 150 - 400 K/uL 162      180      207          This result is from an external source.      Latest Ref Rng & Units 03/15/2022   12:00 AM 05/11/2021   12:00 AM 12/13/2020   12:00 AM  CMP  BUN 4 - '21 15      12   18       '$ Creatinine 0.5 - 1.1 0.8      0.6   0.7       Sodium 137 - 147 141      142   141       Potassium 3.5 - 5.1 mEq/L 3.9      3.7   3.5       Chloride 99 - 108 103      104   104       CO2 13 - '22 29      31   28       '$ Calcium 8.7 - 10.7 8.7      9.0   9.2       Alkaline Phos 25 - 125 58      53   69       AST 13 - 35 34      27   29       ALT 7 - 35 U/L '9      8   8          '$ This result is from an external source.     ASSESSMENT & PLAN:  Assessment/Plan:  An 88 y.o. female with chronic myelogenous leukemia.  Overall, I am very pleased that the patient remains in a complete molecular response despite being switched from Dasatinib to imatinib recently.  Based upon this, she will continue to take imatinib at 300 mg daily.  Despite her issues with  dementia, the patient appears to be doing fairly well.  I will see her back in 3 months for repeat clinical assessment.  The patient and her daughter-in-law understand all the plans discussed today and are in agreement with them.   Margia Wiesen Macarthur Critchley, MD

## 2022-04-16 ENCOUNTER — Other Ambulatory Visit: Payer: Self-pay | Admitting: Oncology

## 2022-04-16 ENCOUNTER — Inpatient Hospital Stay: Payer: PPO | Admitting: Oncology

## 2022-04-16 VITALS — BP 177/79 | HR 77 | Temp 98.1°F | Resp 16 | Ht 63.0 in | Wt 131.3 lb

## 2022-04-16 DIAGNOSIS — C9210 Chronic myeloid leukemia, BCR/ABL-positive, not having achieved remission: Secondary | ICD-10-CM

## 2022-04-19 ENCOUNTER — Other Ambulatory Visit: Payer: PPO

## 2022-04-26 ENCOUNTER — Ambulatory Visit: Payer: PPO | Admitting: Oncology

## 2022-05-22 ENCOUNTER — Telehealth: Payer: Self-pay

## 2022-05-22 NOTE — Telephone Encounter (Addendum)
05/27/2022 I spoke with Yves Dill, owner of [facility] (retirement community & assisted living). Pt is taking the Imatinib '100mg'$  po TID w/ large glass of water No missed doses. Confirmed her next appt as well. Anderson Malta states that pt's family will being her to appointment.  05/24/22 Pt's daughter in law, Cleda Mccreedy, returned my call. She states that Mrs Pinegar has moved into [facility], a retirement & assisted living home here in [place]. She states they have a PA who sees the patient and went today to have blood drawn.    05/22/2022 - I attempted call to pt, to see how she is tolerating the [place]. At last office visit with Dr Bobby Rumpf, it was documented that she was doing pretty good.

## 2022-07-08 ENCOUNTER — Inpatient Hospital Stay: Payer: PPO | Attending: Oncology

## 2022-07-08 DIAGNOSIS — C9210 Chronic myeloid leukemia, BCR/ABL-positive, not having achieved remission: Secondary | ICD-10-CM | POA: Insufficient documentation

## 2022-07-08 LAB — CBC AND DIFFERENTIAL
HCT: 41 (ref 36–46)
Hemoglobin: 13.5 (ref 12.0–16.0)
Neutrophils Absolute: 3.99
Platelets: 188 10*3/uL (ref 150–400)
WBC: 5.7

## 2022-07-08 LAB — CBC: RBC: 4.03 (ref 3.87–5.11)

## 2022-07-14 LAB — BCR-ABL1, CML/ALL, PCR, QUANT: Interpretation (BCRAL):: NEGATIVE

## 2022-07-16 NOTE — Progress Notes (Signed)
[facility]  [address] [place],  [place]  [address] [phone]  Clinic Day:  07/17/2022  Referring physician: Myrlene Broker, MD  HISTORY OF PRESENT ILLNESS:  The patient is a 88 y.o. female with chronic myelogenous leukemia.  She is currently taking imatinib 300 mg twice daily.  She was switched from dasatinib 40 mg earlier this year after she developed pleural effusions.  Although it was presumed the pleural effusions were due to dasatinib, the growing belief she likely had issues with congestive heart failure.  Nevertheless, the patient has tolerated her imatinib therapy fairly well.  She denies having any new symptoms or findings which concern her for complications related to her CML.     PHYSICAL EXAM:  Blood pressure (!) 148/66, pulse 70, temperature 97.7 F (36.5 C), resp. rate 16, height '5\' 3"'$  (1.6 m), weight 126 lb 12.8 oz (57.5 kg), SpO2 98 %. Wt Readings from Last 3 Encounters:  07/17/22 126 lb 12.8 oz (57.5 kg)  04/16/22 131 lb 4.8 oz (59.6 kg)  03/15/22 132 lb (59.9 kg)   Body mass index is 22.46 kg/m. Performance status (ECOG): 1 Physical Exam Constitutional:      Appearance: Normal appearance. She is not ill-appearing.  HENT:     Mouth/Throat:     Mouth: Mucous membranes are moist.     Pharynx: Oropharynx is clear. No oropharyngeal exudate or posterior oropharyngeal erythema.  Cardiovascular:     Rate and Rhythm: Normal rate and regular rhythm.     Heart sounds: No murmur heard.    No friction rub. No gallop.  Pulmonary:     Effort: Pulmonary effort is normal. No respiratory distress.     Breath sounds: Normal breath sounds. No wheezing, rhonchi or rales.  Abdominal:     General: Bowel sounds are normal. There is no distension.     Palpations: Abdomen is soft. There is no mass.     Tenderness: There is no abdominal tenderness.  Musculoskeletal:        General: No swelling.     Right lower leg: No edema.     Left  lower leg: No edema.  Lymphadenopathy:     Cervical: No cervical adenopathy.     Upper Body:     Right upper body: No supraclavicular or axillary adenopathy.     Left upper body: No supraclavicular or axillary adenopathy.     Lower Body: No right inguinal adenopathy. No left inguinal adenopathy.  Skin:    General: Skin is warm.     Coloration: Skin is not jaundiced.     Findings: No lesion or rash.  Neurological:     General: No focal deficit present.     Mental Status: She is alert and oriented to person, place, and time. Mental status is at baseline.  Psychiatric:        Mood and Affect: Mood normal.        Behavior: Behavior normal.        Thought Content: Thought content normal.    LABS:      Latest Ref Rng & Units 07/08/2022   12:00 AM 04/03/2022   12:00 AM 03/15/2022   12:00 AM  CBC  WBC  5.7     4.9     6.3      Hemoglobin 12.0 - 16.0 13.5     12.6     12.8      Hematocrit 36 - 46 41  40     42      Platelets 150 - 400 K/uL 188     162     180         This result is from an external source.      Latest Ref Rng & Units 03/15/2022   12:00 AM 05/11/2021   12:00 AM 12/13/2020   12:00 AM  CMP  BUN 4 - '21 15     12  18      '$ Creatinine 0.5 - 1.1 0.8     0.6  0.7      Sodium 137 - 147 141     142  141      Potassium 3.5 - 5.1 mEq/L 3.9     3.7  3.5      Chloride 99 - 108 103     104  104      CO2 13 - '22 29     31  28      '$ Calcium 8.7 - 10.7 8.7     9.0  9.2      Alkaline Phos 25 - 125 58     53  69      AST 13 - 35 34     27  29      ALT 7 - 35 U/L '9     8  8         '$ This result is from an external source.   Recent CML molecular testing done on 07-08-22 revealed the following: ASSESSMENT & PLAN:    Assessment/Plan:  An 88 y.o. female with chronic myelogenous leukemia.  Overall, I am very pleased that the patient remains in a complete molecular response since being switched from dasatinib to imatinib recently.  She will continue to take imatinib at 300 mg daily.   Despite her issues with dementia, the patient appears to be doing fairly well.  I will see her back in 4 months for repeat clinical assessment.  The patient and her daughter-in-law understand all the plans discussed today and are in agreement with them.   Juanita Devincent Macarthur Critchley, MD

## 2022-07-17 ENCOUNTER — Inpatient Hospital Stay (INDEPENDENT_AMBULATORY_CARE_PROVIDER_SITE_OTHER): Payer: PPO | Admitting: Oncology

## 2022-07-17 VITALS — BP 148/66 | HR 70 | Temp 97.7°F | Resp 16 | Ht 63.0 in | Wt 126.8 lb

## 2022-07-17 DIAGNOSIS — C9210 Chronic myeloid leukemia, BCR/ABL-positive, not having achieved remission: Secondary | ICD-10-CM | POA: Diagnosis not present

## 2022-07-19 ENCOUNTER — Other Ambulatory Visit: Payer: Self-pay | Admitting: Oncology

## 2022-07-19 DIAGNOSIS — C9210 Chronic myeloid leukemia, BCR/ABL-positive, not having achieved remission: Secondary | ICD-10-CM

## 2022-08-30 ENCOUNTER — Telehealth: Payer: Self-pay

## 2022-08-30 NOTE — Telephone Encounter (Signed)
Joanna Herrera,manager @ [facility] (group home) reports that pt spits and vomits all day long @ times. She asked that I call Joanna Herrera, for Joanna Herrera after talking with Joanna Bobby Rumpf. Joanna Herrera, sees the pt in the group home. When I spoke to [facility], she wasn't sure if the vomiting/spitting up daily it is related to [facility] or what. She is concerned because pt is having the vomiting and weight loss. She has referred her to a GI physician for evaluation. I notified Joanna Bobby Rumpf of all above. He replied, "I just don't think think the vomiting is coming from the [place]. She has dementia. I just saw her recently and her disease is well under control. I don't think there is anything to warrant her coming back in to see me until December". I notified Joanna Herrera of Joanna Bobby Rumpf' response. She verbalized understanding.

## 2022-09-05 ENCOUNTER — Telehealth: Payer: Self-pay

## 2022-09-05 NOTE — Telephone Encounter (Signed)
   Dr Bobby Rumpf:  I'm fine with a gleevec break. I called [name], and notified her of this.  Yves Dill, [employer] [facility] LVM on nurse line stating the family had called her and wanted to see if she would give [place] a call as well. She states that pt has loss 11 pounds in 2.5 weeks. She spits up a lot, but she will also eat @ times. She does have diarrhea everyday. Her daughter told Anderson Malta, "stopping the [place] for a while shouldn't hurt. They said the leukemia wouldn't be what kills her anyway".   Kaitlyn Schomburg,RPH: That would be up to Dr. Bobby Rumpf - if they are interested in holding she can have some TKI withdrawal syndrome: this would include some musculoskeletal pain and pruritus that would go away but would possibly happen the first two weeks of holding [place].  Danique Hartsough,RN: Pt's daughter, [name], called to make [place] aware that pt has been in hospital @ [facility]. Her mom continues to have a lot of issues, esp diarrhea, nausea, intermittent spitting up/vomiting, and weight loss. They did find she had a large pleural effusion, enlarged heart, and possible colitis.  Pt had thoracentesis today, yielding 990m. Pt's daughter is just worried because no one can figure out what is going on with her. "She has constant diarrhea"  per daughter. Daughter mentioned going back [facility] [place] for a trial basis [facility]

## 2022-09-20 ENCOUNTER — Telehealth: Payer: Self-pay | Admitting: Gastroenterology

## 2022-09-20 NOTE — Telephone Encounter (Signed)
Inbound call from patient son requesting to speak with a nurse in regards to patient having acid reflux issues and patient has been vomiting. Please advise.

## 2022-09-20 NOTE — Telephone Encounter (Signed)
Unable to reach pt son: Phone rings continuously without voice mail

## 2022-09-23 NOTE — Telephone Encounter (Unsigned)
Left message for pt/son to call back.

## 2022-09-25 NOTE — Telephone Encounter (Signed)
No return call received from patient or her son. Will await further communication from patient.

## 2022-11-08 ENCOUNTER — Ambulatory Visit: Payer: PPO | Admitting: Gastroenterology

## 2022-11-11 ENCOUNTER — Other Ambulatory Visit: Payer: PPO

## 2022-11-20 ENCOUNTER — Ambulatory Visit: Payer: PPO | Admitting: Oncology

## 2022-12-20 ENCOUNTER — Telehealth: Payer: Self-pay | Admitting: Oncology

## 2022-12-31 ENCOUNTER — Other Ambulatory Visit: Payer: Self-pay | Admitting: Oncology

## 2022-12-31 DIAGNOSIS — C9210 Chronic myeloid leukemia, BCR/ABL-positive, not having achieved remission: Secondary | ICD-10-CM

## 2022-12-31 NOTE — Progress Notes (Incomplete)
[facility]  [address] [place],  [place]  [address] [phone]  Clinic Day:  07/17/2022  Referring physician: Myrlene Broker, MD  HISTORY OF PRESENT ILLNESS:  The patient is a 89 y.o. female with chronic myelogenous leukemia.  She is currently taking imatinib 300 mg twice daily.  She was switched from dasatinib 40 mg earlier this year after she developed pleural effusions.  Although it was presumed the pleural effusions were due to dasatinib, the growing belief she likely had issues with congestive heart failure.  Nevertheless, the patient has tolerated her imatinib therapy fairly well.  She denies having any new symptoms or findings which concern her for complications related to her CML.     PHYSICAL EXAM:  There were no vitals taken for this visit. Wt Readings from Last 3 Encounters:  07/17/22 126 lb 12.8 oz (57.5 kg)  04/16/22 131 lb 4.8 oz (59.6 kg)  03/15/22 132 lb (59.9 kg)   There is no height or weight on file to calculate BMI. Performance status (ECOG): 1 Physical Exam Constitutional:      Appearance: Normal appearance. She is not ill-appearing.  HENT:     Mouth/Throat:     Mouth: Mucous membranes are moist.     Pharynx: Oropharynx is clear. No oropharyngeal exudate or posterior oropharyngeal erythema.  Cardiovascular:     Rate and Rhythm: Normal rate and regular rhythm.     Heart sounds: No murmur heard.    No friction rub. No gallop.  Pulmonary:     Effort: Pulmonary effort is normal. No respiratory distress.     Breath sounds: Normal breath sounds. No wheezing, rhonchi or rales.  Abdominal:     General: Bowel sounds are normal. There is no distension.     Palpations: Abdomen is soft. There is no mass.     Tenderness: There is no abdominal tenderness.  Musculoskeletal:        General: No swelling.     Right lower leg: No edema.     Left lower leg: No edema.  Lymphadenopathy:     Cervical: No cervical adenopathy.      Upper Body:     Right upper body: No supraclavicular or axillary adenopathy.     Left upper body: No supraclavicular or axillary adenopathy.     Lower Body: No right inguinal adenopathy. No left inguinal adenopathy.  Skin:    General: Skin is warm.     Coloration: Skin is not jaundiced.     Findings: No lesion or rash.  Neurological:     General: No focal deficit present.     Mental Status: She is alert and oriented to person, place, and time. Mental status is at baseline.  Psychiatric:        Mood and Affect: Mood normal.        Behavior: Behavior normal.        Thought Content: Thought content normal.    LABS:      Latest Ref Rng & Units 07/08/2022   12:00 AM 04/03/2022   12:00 AM 03/15/2022   12:00 AM  CBC  WBC  5.7     4.9     6.3      Hemoglobin 12.0 - 16.0 13.5     12.6     12.8      Hematocrit 36 - 46 41     40     42      Platelets 150 - 400  K/uL 188     162     180         This result is from an external source.       Latest Ref Rng & Units 03/15/2022   12:00 AM 05/11/2021   12:00 AM 12/13/2020   12:00 AM  CMP  BUN 4 - '21 15     12  18      '$ Creatinine 0.5 - 1.1 0.8     0.6  0.7      Sodium 137 - 147 141     142  141      Potassium 3.5 - 5.1 mEq/L 3.9     3.7  3.5      Chloride 99 - 108 103     104  104      CO2 13 - '22 29     31  28      '$ Calcium 8.7 - 10.7 8.7     9.0  9.2      Alkaline Phos 25 - 125 58     53  69      AST 13 - 35 34     27  29      ALT 7 - 35 U/L '9     8  8         '$ This result is from an external source.    Recent CML molecular testing done on 07-08-22 revealed the following: ASSESSMENT & PLAN:    Assessment/Plan:  An 89 y.o. female with chronic myelogenous leukemia.  Overall, I am very pleased that the patient remains in a complete molecular response since being switched from dasatinib to imatinib recently.  She will continue to take imatinib at 300 mg daily.  Despite her issues with dementia, the patient appears to be doing fairly well.   I will see her back in 4 months for repeat clinical assessment.  The patient and her daughter-in-law understand all the plans discussed today and are in agreement with them.   Caitlin Hillmer Macarthur Critchley, MD

## 2023-01-01 ENCOUNTER — Other Ambulatory Visit: Payer: PPO | Admitting: Oncology

## 2023-01-01 ENCOUNTER — Inpatient Hospital Stay: Attending: Oncology

## 2023-01-01 ENCOUNTER — Inpatient Hospital Stay: Admitting: Oncology

## 2023-01-01 DIAGNOSIS — C9210 Chronic myeloid leukemia, BCR/ABL-positive, not having achieved remission: Secondary | ICD-10-CM | POA: Insufficient documentation

## 2023-01-01 LAB — CBC WITH DIFFERENTIAL (CANCER CENTER ONLY)
Abs Immature Granulocytes: 0.02 10*3/uL (ref 0.00–0.07)
Basophils Absolute: 0 10*3/uL (ref 0.0–0.1)
Basophils Relative: 0 %
Eosinophils Absolute: 0.2 10*3/uL (ref 0.0–0.5)
Eosinophils Relative: 3 %
HCT: 45.3 % (ref 36.0–46.0)
Hemoglobin: 14.2 g/dL (ref 12.0–15.0)
Immature Granulocytes: 0 %
Lymphocytes Relative: 15 %
Lymphs Abs: 1 10*3/uL (ref 0.7–4.0)
MCH: 33.3 pg (ref 26.0–34.0)
MCHC: 31.3 g/dL (ref 30.0–36.0)
MCV: 106.1 fL — ABNORMAL HIGH (ref 80.0–100.0)
Monocytes Absolute: 0.7 10*3/uL (ref 0.1–1.0)
Monocytes Relative: 11 %
Neutro Abs: 4.4 10*3/uL (ref 1.7–7.7)
Neutrophils Relative %: 71 %
Platelet Count: 171 10*3/uL (ref 150–400)
RBC: 4.27 MIL/uL (ref 3.87–5.11)
RDW: 13.5 % (ref 11.5–15.5)
WBC Count: 6.3 10*3/uL (ref 4.0–10.5)
nRBC: 0 % (ref 0.0–0.2)

## 2023-01-01 LAB — CMP (CANCER CENTER ONLY)
ALT: 9 U/L (ref 0–44)
AST: 20 U/L (ref 15–41)
Albumin: 3.8 g/dL (ref 3.5–5.0)
Alkaline Phosphatase: 49 U/L (ref 38–126)
Anion gap: 8 (ref 5–15)
BUN: 18 mg/dL (ref 8–23)
CO2: 32 mmol/L (ref 22–32)
Calcium: 9 mg/dL (ref 8.9–10.3)
Chloride: 103 mmol/L (ref 98–111)
Creatinine: 0.69 mg/dL (ref 0.44–1.00)
GFR, Estimated: >60 mL/min (ref >60)
Glucose, Bld: 141 mg/dL — ABNORMAL HIGH (ref 70–99)
Potassium: 3.8 mmol/L (ref 3.5–5.1)
Sodium: 143 mmol/L (ref 135–145)
Total Bilirubin: 1.1 mg/dL (ref 0.3–1.2)
Total Protein: 6.5 g/dL (ref 6.5–8.1)

## 2023-01-08 NOTE — Progress Notes (Addendum)
[facility]  [address] [place],  [place]  [address] [phone]  Clinic Day:  01/09/2023  Referring physician: Hadley Pen, MD  TELEPHONE VISIT  PROVIDER LOCATION:  Cancer Clinic PATIENT LOCATION:  Home  HISTORY OF PRESENT ILLNESS:  The patient is a 89 y.o. female with chronic myelogenous leukemia.  She is currently taking imatinib 300 mg twice daily.  She was switched from dasatinib 40 mg earlier this year after she developed pleural effusions.  Although it was presumed the pleural effusions were due to dasatinib, the growing belief she likely had issues with congestive heart failure.  A telephone visit is scheduled today with her family to go over her recent CML results.   Due to her multiple comorbidities, the patient is currently on hospice.  Because of this, the patient has not been taking her imatinib.  PHYSICAL EXAM: DEFERRED   LABS:      Latest Ref Rng & Units 01/01/2023   10:52 AM 07/08/2022   12:00 AM 04/03/2022   12:00 AM  CBC  WBC 4.0 - 10.5 K/uL 6.3  5.7     4.9      Hemoglobin 12.0 - 15.0 g/dL 27.0  35.0     09.3      Hematocrit 36.0 - 46.0 % 45.3  41     40      Platelets 150 - 400 K/uL 171  188     162         This result is from an external source.       Latest Ref Rng & Units 01/01/2023   10:52 AM 03/15/2022   12:00 AM 05/11/2021   12:00 AM  CMP  Glucose 70 - 99 mg/dL 818     BUN 8 - 23 mg/dL 18  15     12    Creatinine 0.44 - 1.00 mg/dL 2.99  0.8     0.6   Sodium 135 - 145 mmol/L 143  141     142   Potassium 3.5 - 5.1 mmol/L 3.8  3.9     3.7   Chloride 98 - 111 mmol/L 103  103     104   CO2 22 - 32 mmol/L 32  29     31   Calcium 8.9 - 10.3 mg/dL 9.0  8.7     9.0   Total Protein 6.5 - 8.1 g/dL 6.5     Total Bilirubin 0.3 - 1.2 mg/dL 1.1     Alkaline Phos 38 - 126 U/L 49  58     53   AST 15 - 41 U/L 20  34     27   ALT 0 - 44 U/L 9  9     8       This result is from an external source.   Recent CML molecular  testing done on 01-01-23 revealed the following:  Latest Reference Range & Units 01/01/23 10:51  b2a2 transcript % 2.1294  b3a2 transcript % Comment  E1A2 Transcript % Comment  Interpretation (BCRAL):  ABL1 e13a2 (b2a2, p210) fusion transcript PRESENT  Director Review [facility]):  Comment  !: Data is abnormal  ASSESSMENT & PLAN:  Assessment/Plan:  A 89 y.o. female with chronic myelogenous leukemia.  Recent labs suggest there is molecular presence of her CML.  This is not particularly surprising as her family claims she has not been taking her imatinib therapy since she has been under hospice care.  However,  it appears she may be taken off of hospice over the next few weeks.  Regardless of if this happens or not, due to her age and other comorbidities, the patient's family wishes to be as conservative as possible with respect to her CML  management.  As it appears her CML is not causing her any particular problems, her imatinib therapy will continue to be held.  It would only be restarted if her CML becomes symptomatic and begins to impact her daily quality of life.  If the patient comes off of hospice, I will reassess her CML in 4 months.  The patient's family understands the plans discussed per this telephone appointment and are in agreement with them.  20 minutes were spent per this phone appointment.  Zurisadai Helminiak Kirby Funk, MD

## 2023-01-09 ENCOUNTER — Other Ambulatory Visit: Payer: Self-pay | Admitting: Oncology

## 2023-01-09 ENCOUNTER — Inpatient Hospital Stay (HOSPITAL_BASED_OUTPATIENT_CLINIC_OR_DEPARTMENT_OTHER): Admitting: Oncology

## 2023-01-09 DIAGNOSIS — C9210 Chronic myeloid leukemia, BCR/ABL-positive, not having achieved remission: Secondary | ICD-10-CM

## 2023-01-09 LAB — BCR-ABL1, CML/ALL, PCR, QUANT: b2a2 transcript: 2.1294 %

## 2023-01-12 ENCOUNTER — Other Ambulatory Visit: Payer: Self-pay | Admitting: Oncology

## 2023-01-12 DIAGNOSIS — C9210 Chronic myeloid leukemia, BCR/ABL-positive, not having achieved remission: Secondary | ICD-10-CM

## 2023-01-13 ENCOUNTER — Telehealth: Payer: Self-pay | Admitting: Oncology

## 2023-01-13 NOTE — Telephone Encounter (Signed)
Patient has been scheduled. Aware of appt date and time     Status Change Notification  The following message was taken by Neva Seat. Scheduling Message Entered by Lavera Guise A on 01/12/2023 at  3:36 PM Priority: Routine <No visit type provided>  Department: [facility]  Provider:  Scheduling Notes:  Please schedule f/u appt for 05-09-23, with a lab appt on 05-02-23.  This may change, particularly if the patient does not come off of hospice

## 2023-02-03 ENCOUNTER — Other Ambulatory Visit (HOSPITAL_COMMUNITY): Payer: Self-pay

## 2023-05-02 ENCOUNTER — Inpatient Hospital Stay: Payer: PPO | Attending: Oncology

## 2023-05-02 DIAGNOSIS — C9210 Chronic myeloid leukemia, BCR/ABL-positive, not having achieved remission: Secondary | ICD-10-CM | POA: Diagnosis present

## 2023-05-02 LAB — CBC WITH DIFFERENTIAL (CANCER CENTER ONLY)
Abs Immature Granulocytes: 0.02 10*3/uL (ref 0.00–0.07)
Basophils Absolute: 0 10*3/uL (ref 0.0–0.1)
Basophils Relative: 1 %
Eosinophils Absolute: 0.1 10*3/uL (ref 0.0–0.5)
Eosinophils Relative: 2 %
HCT: 47.6 % — ABNORMAL HIGH (ref 36.0–46.0)
Hemoglobin: 15.5 g/dL — ABNORMAL HIGH (ref 12.0–15.0)
Immature Granulocytes: 0 %
Lymphocytes Relative: 21 %
Lymphs Abs: 1.1 10*3/uL (ref 0.7–4.0)
MCH: 33.4 pg (ref 26.0–34.0)
MCHC: 32.6 g/dL (ref 30.0–36.0)
MCV: 102.6 fL — ABNORMAL HIGH (ref 80.0–100.0)
Monocytes Absolute: 0.6 10*3/uL (ref 0.1–1.0)
Monocytes Relative: 11 %
Neutro Abs: 3.4 10*3/uL (ref 1.7–7.7)
Neutrophils Relative %: 65 %
Platelet Count: 182 10*3/uL (ref 150–400)
RBC: 4.64 MIL/uL (ref 3.87–5.11)
RDW: 13.6 % (ref 11.5–15.5)
WBC Count: 5.3 10*3/uL (ref 4.0–10.5)
nRBC: 0 % (ref 0.0–0.2)

## 2023-05-08 LAB — BCR-ABL1, CML/ALL, PCR, QUANT: b2a2 transcript: 4.8713 %

## 2023-05-08 NOTE — Progress Notes (Deleted)
[facility]  [address] [place],  [place]  [address] [phone]  Clinic Day:  01/09/2023  Referring physician: Hadley Pen, MD  TELEPHONE VISIT  PROVIDER LOCATION:  Cancer Clinic PATIENT LOCATION:  Home  HISTORY OF PRESENT ILLNESS:  The patient is a 89 y.o. female with chronic myelogenous leukemia.  She is currently taking imatinib 300 mg twice daily.  She was switched from dasatinib 40 mg earlier this year after she developed pleural effusions.  Although it was presumed the pleural effusions were due to dasatinib, the growing belief she likely had issues with congestive heart failure.  A telephone visit is scheduled today with her family to go over her recent CML results.   Due to her multiple comorbidities, the patient is currently on hospice.  Because of this, the patient has not been taking her imatinib.  PHYSICAL EXAM: DEFERRED   LABS:      Latest Ref Rng & Units 05/02/2023   11:15 AM 01/01/2023   10:52 AM 07/08/2022   12:00 AM  CBC  WBC 4.0 - 10.5 K/uL 5.3  6.3  5.7      Hemoglobin 12.0 - 15.0 g/dL 91.4  78.2  95.6      Hematocrit 36.0 - 46.0 % 47.6  45.3  41      Platelets 150 - 400 K/uL 182  171  188         This result is from an external source.      Latest Ref Rng & Units 01/01/2023   10:52 AM 03/15/2022   12:00 AM 05/11/2021   12:00 AM  CMP  Glucose 70 - 99 mg/dL 213     BUN 8 - 23 mg/dL 18  15     12    Creatinine 0.44 - 1.00 mg/dL 0.86  0.8     0.6   Sodium 135 - 145 mmol/L 143  141     142   Potassium 3.5 - 5.1 mmol/L 3.8  3.9     3.7   Chloride 98 - 111 mmol/L 103  103     104   CO2 22 - 32 mmol/L 32  29     31   Calcium 8.9 - 10.3 mg/dL 9.0  8.7     9.0   Total Protein 6.5 - 8.1 g/dL 6.5     Total Bilirubin 0.3 - 1.2 mg/dL 1.1     Alkaline Phos 38 - 126 U/L 49  58     53   AST 15 - 41 U/L 20  34     27   ALT 0 - 44 U/L 9  9     8       This result is from an external source.  Recent CML molecular testing done on  01-01-23 revealed the following:  Latest Reference Range & Units 01/01/23 10:51 05/02/23 11:11  b2a2 transcript % 2.1294 4.8713  b3a2 transcript % Comment Comment  E1A2 Transcript % Comment Comment  Interpretation (BCRAL):  ABL1 e13a2 (b2a2, p210) fusion transcript. ! ABL1 e13a2 (b2a2, p210) fusion transcript. !  Director Review [facility]):  Comment Comment  !: Data is abnormal  ASSESSMENT & PLAN:  Assessment/Plan:  A 89 y.o. female with chronic myelogenous leukemia.  Recent labs suggest there is molecular presence of her CML.  This is not particularly surprising as her family claims she has not been taking her imatinib therapy since she has been under hospice care.  However, it  appears she may be taken off of hospice over the next few weeks.  Regardless of if this happens or not, due to her age and other comorbidities, the patient's family wishes to be as conservative as possible with respect to her CML  management.  As it appears her CML is not causing her any particular problems, her imatinib therapy will continue to be held.  It would only be restarted if her CML becomes symptomatic and begins to impact her daily quality of life.  If the patient comes off of hospice, I will reassess her CML in 4 months.  The patient's family understands the plans discussed per this telephone appointment and are in agreement with them.  20 minutes were spent per this phone appointment.  Sharnelle Cappelli Kirby Funk, MD

## 2023-05-09 ENCOUNTER — Inpatient Hospital Stay (INDEPENDENT_AMBULATORY_CARE_PROVIDER_SITE_OTHER): Payer: PPO | Admitting: Oncology

## 2023-05-09 ENCOUNTER — Other Ambulatory Visit: Payer: Self-pay | Admitting: Oncology

## 2023-05-09 VITALS — BP 170/79 | HR 75 | Temp 97.7°F | Resp 16 | Ht 63.0 in | Wt 124.5 lb

## 2023-05-09 DIAGNOSIS — C9210 Chronic myeloid leukemia, BCR/ABL-positive, not having achieved remission: Secondary | ICD-10-CM

## 2023-05-09 NOTE — Progress Notes (Signed)
[facility]  [address] [place],  [place]  [address] [phone]  Clinic Day:  05/09/2023  Referring physician: Hadley Pen, MD   HISTORY OF PRESENT ILLNESS:  The patient is a 89 y.o. female with chronic myelogenous leukemia.  She was previously taking imatinib 300 mg twice daily.  She was switched from dasatinib after she developed pleural effusions.  However, over these past months, the patient has been off all forms of CML therapy due to a decline in her health.  She currently resides at a local residential nursing facility.  The patient claims she has been doing better.  Her appetite has definitely picked up since being moved to her facility.  As it pertains to her CML, she denies having any B symptoms, fatigue, or other systemic symptoms which concern her for disease progression since she has been off her oral TKI therapy.  PHYSICAL EXAM:  Blood pressure (!) 170/79, pulse 75, temperature 97.7 F (36.5 C), resp. rate 16, height 5\' 3"  (1.6 m), weight 124 lb 8 oz (56.5 kg), SpO2 92 %. Wt Readings from Last 3 Encounters:  05/09/23 124 lb 8 oz (56.5 kg)  07/17/22 126 lb 12.8 oz (57.5 kg)  04/16/22 131 lb 4.8 oz (59.6 kg)   Body mass index is 22.05 kg/m. Performance status (ECOG): 1 - Symptomatic but completely ambulatory Physical Exam Constitutional:      Appearance: Normal appearance. She is not ill-appearing.     Comments: A pleasant older woman with some baseline dementia  HENT:     Mouth/Throat:     Mouth: Mucous membranes are moist.     Pharynx: Oropharynx is clear. No oropharyngeal exudate or posterior oropharyngeal erythema.  Cardiovascular:     Rate and Rhythm: Normal rate and regular rhythm.     Heart sounds: No murmur heard.    No friction rub. No gallop.  Pulmonary:     Effort: Pulmonary effort is normal. No respiratory distress.     Breath sounds: Normal breath sounds. No wheezing, rhonchi or rales.  Abdominal:      General: Bowel sounds are normal. There is no distension.     Palpations: Abdomen is soft. There is no mass.     Tenderness: There is no abdominal tenderness.  Musculoskeletal:        General: No swelling.     Right lower leg: No edema.     Left lower leg: No edema.  Lymphadenopathy:     Cervical: No cervical adenopathy.     Upper Body:     Right upper body: No supraclavicular or axillary adenopathy.     Left upper body: No supraclavicular or axillary adenopathy.     Lower Body: No right inguinal adenopathy. No left inguinal adenopathy.  Skin:    General: Skin is warm.     Coloration: Skin is not jaundiced.     Findings: No lesion or rash.  Neurological:     General: No focal deficit present.     Mental Status: She is alert and oriented to person, place, and time. Mental status is at baseline.  Psychiatric:        Mood and Affect: Mood normal.        Behavior: Behavior normal.        Thought Content: Thought content normal.    LABS:      Latest Ref Rng & Units 05/02/2023   11:15 AM 01/01/2023   10:52 AM 07/08/2022   12:00 AM  CBC  WBC 4.0 - 10.5 K/uL 5.3  6.3  5.7      Hemoglobin 12.0 - 15.0 g/dL 16.1  09.6  04.5      Hematocrit 36.0 - 46.0 % 47.6  45.3  41      Platelets 150 - 400 K/uL 182  171  188         This result is from an external source.      Latest Ref Rng & Units 01/01/2023   10:52 AM 03/15/2022   12:00 AM 05/11/2021   12:00 AM  CMP  Glucose 70 - 99 mg/dL 409     BUN 8 - 23 mg/dL 18  15     12    Creatinine 0.44 - 1.00 mg/dL 8.11  0.8     0.6   Sodium 135 - 145 mmol/L 143  141     142   Potassium 3.5 - 5.1 mmol/L 3.8  3.9     3.7   Chloride 98 - 111 mmol/L 103  103     104   CO2 22 - 32 mmol/L 32  29     31   Calcium 8.9 - 10.3 mg/dL 9.0  8.7     9.0   Total Protein 6.5 - 8.1 g/dL 6.5     Total Bilirubin 0.3 - 1.2 mg/dL 1.1     Alkaline Phos 38 - 126 U/L 49  58     53   AST 15 - 41 U/L 20  34     27   ALT 0 - 44 U/L 9  9     8       This result is from an  external source.    Latest Reference Range & Units 01/01/23 10:51 05/02/23 11:11  b2a2 transcript % 2.1294 4.8713  b3a2 transcript % Comment Comment  E1A2 Transcript % Comment Comment  Interpretation (BCRAL):  ABL1 e13a2 (b2a2, p210) fusion transcript. ! ABL1 e13a2 (b2a2, p210) fusion transcript. !  !: Data is abnormal  ASSESSMENT & PLAN:  Assessment/Plan:  A 89 y.o. female with CML.  I am pleased as her peripheral counts remain ideal.  Unsurprisingly, her CML transcripts have risen since she has been off TKI therapy since February 2024.  As she is at a nursing facility, her CML will be managed conservatively.  I would only consider reinitiating therapy if her peripheral counts abruptly change or she somehow becomes symptomatic related to her underlying disease.  As she is clinically doing well, I will see her back in another 4 months for repeat clinical assessment.  The patient and her family understand all the plans discussed today and are in agreement with them.      Isak Sotomayor Kirby Funk, MD

## 2023-05-12 ENCOUNTER — Telehealth: Payer: Self-pay | Admitting: Oncology

## 2023-05-12 NOTE — Telephone Encounter (Signed)
Contacted pt to schedule an appt. Unable to reach via phone, voicemail was left.  Scheduling Message Entered by Rennis Harding A on 05/09/2023 at  6:35 PM Priority: Routine <No visit type provided>  Department: [facility]  Provider:  Scheduling Notes:  Labs on 09-01-23  Appt on 09-08-23

## 2023-05-14 NOTE — Telephone Encounter (Signed)
Family wishes to hold off on scheduling any more follow up appts. They wish to reconvene maybe in 6 months to determine if pt needs to be seen or not, due to her age they may just stop all together but will decide then.

## 2023-06-25 NOTE — Telephone Encounter (Signed)
Created in error

## 2024-01-20 NOTE — Progress Notes (Addendum)
 Physical Examination:  VITAL SIGNS:  Vitals:   01/20/24 1432  BP: 149/82  Pulse: 77  SpO2: 94%   @WEIGHTVITALS @  General:  Resting comfortably in NAD Neuro:  Alert & oriented X 3.   Resp:  Resp even and nonlabored.  Lungs sounds clear throughout CV:  Regular rate and rhythm,no murmur , No gallops or rubs GI:  Soft, nontender, non-distended, NABS Ext:  No edema Neck:  No JVD Pulses:  2+ and symmetrical upper and lower extremities  Hildegard Rinks, MD, MD, [facility] 01/20/2024, 3:30 PM   Past Medical History,  Past Surgical History, Family History, Social History, Medications, Allergies, Social History: As reviewed in EPIC  Review of Systems: All positive and pertinent negatives are noted in the HPI; otherwise all other systems are negative  Objective Data Reviewed During this Patient Encounter:   EKG:  No results found for: CHOL, TRIG, HDL, LDL Lab Results  Component Value Date   WBC 6.40 01/12/2024   HGB 13.9 01/12/2024   HCT 41.5 01/12/2024   PLT 210 01/12/2024   Lab Results  Component Value Date   NA 143 01/12/2024   K 3.8 01/12/2024   CL 103 01/12/2024   CO2 32 (H) 01/12/2024   BUN 14 01/12/2024   CREATININE 0.73 01/12/2024   AST 21 01/12/2024   ALT 4 (L) 01/12/2024      Current Outpatient Medications:  .  acetaminophen (TYLENOL) 500 mg tablet, Take 500 mg by mouth every 4 (four) hours as needed., Disp: 60 tablet, Rfl: 11 .  alum-mag hydroxide-simethicone (MAALOX, MYLANTA) 200-200-20 mg/5 mL susp suspension, Take 30 mL by mouth every 6 (six) hours as needed., Disp: , Rfl:  .  apixaban (Eliquis) 5 mg tab, Take 1 tablet (5 mg total) by mouth 2 (two) times a day., Disp: 60 tablet, Rfl: 3 .  aspirin 81 mg EC tablet, Take 81 mg by mouth Once Daily., Disp: , Rfl:  .  calcium carbonate (TUMS) 500 mg (200 mg calcium) chewable tablet, Take 2 tablets by mouth every 6 (six) hours as needed for heartburn., Disp: , Rfl:  .  chlorzoxazone (LORZONE) 500  mg tablet, Take 500 mg by mouth 3 (three) times a day as needed (muscle spasms)., Disp: 60 tablet, Rfl: 11 .  donepeziL  (ARICEPT ) 10 mg tablet, Take 10 mg by mouth nightly., Disp: 90 tablet, Rfl: 1 .  DULoxetine (CYMBALTA) 60 mg capsule, Take 60 mg by mouth Once Daily., Disp: 90 capsule, Rfl: 3 .  furosemide (LASIX) 40 mg tablet, Take 40 mg by mouth 2 (two) times a day., Disp: 180 tablet, Rfl: 3 .  guaiFENesin (ROBITUSSIN) 100 mg/5 mL syrup, Take 300 mg by mouth 4 (four) times a day as needed for cough., Disp: , Rfl:  .  levothyroxine (SYNTHROID) 75 mcg tablet, TAKE ONE TABLET BY MOUTH EVERY DAY AT SIX IN THE MORNING, Disp: 90 tablet, Rfl: 1 .  lisinopriL (PRINIVIL) 2.5 mg tablet, Take 2.5 mg by mouth Once Daily., Disp: 90 tablet, Rfl: 3 .  loperamide (IMODIUM A-D) 2 mg tablet, Take 4 mg by mouth 4 (four) times a day as needed for diarrhea., Disp: , Rfl:  .  LORazepam (ATIVAN) 0.5 mg tablet, Take 0.5 mg by mouth every 6 (six) hours as needed for anxiety., Disp: 30 tablet, Rfl: 0 .  magnesium hydroxide (MILK OF MAGNESIA) 400 mg/5 mL suspension, Take 30 mL by mouth nightly as needed., Disp: , Rfl:  .  memantine  (NAMENDA ) 10 mg tablet, Take 10 mg by mouth 2 (two) times a day., Disp: 180 tablet, Rfl: 3 .  metoprolol succinate (TOPROL XL) 50 mg 24 hr tablet, TAKE 1 TABLET BY MOUTH ONCE DAILY * DO NOT CRUSH * **NOTE DOSE AND STRENGTH** **NO REFILLS**, Disp: 30 tablet, Rfl: 10 .  mirtazapine (REMERON) 15 mg tablet, TAKE ONE TABLET BY MOUTH NIGHTLY, Disp: 90 tablet, Rfl: 3 .  neomycin-bacitracinZn-polymyxnB (Triple Antibiotic) ointment, Apply 1 Application topically daily as needed., Disp: , Rfl:  .  nutritional drink (Ensure) liqd liquid, Take 1 Can by mouth 4 (four) times a day as needed., Disp: , Rfl:  .  omeprazole (PriLOSEC) 40 mg DR capsule, Take 40 mg by mouth Once Daily., Disp: , Rfl:  .  ondansetron (ZOFRAN) 4 mg tablet, Take 4 mg by mouth every 8 (eight) hours as needed for nausea., Disp: , Rfl:   .  phosphorated carbohydrate (Anti-Nausea) oral solution, Take 15 mL by mouth every 15 (fifteen) minutes as needed., Disp: , Rfl:  .  potassium chloride (KLOR-CON) 10 mEq ER tablet, Take 10 mEq by mouth Once Daily., Disp: 90 tablet, Rfl: 1 .  potassium chloride (KLOR-CON) 20 mEq ER tablet, Take 40 mEq by mouth Once Daily., Disp: , Rfl:  .  spironolactone (ALDACTONE) 25 mg tablet, Take 25 mg by mouth Once Daily for 30 days., Disp: 30 tablet, Rfl: 0

## 2024-07-13 NOTE — Progress Notes (Signed)
 Office Visit  PCP: Delon Bartley Schiller, AGNP  Reason for Visit:   follow up  HPI:  History of Present Illness Joanna Herrera is a 89 y.o. female with past problems including: Paroxysmal atrial fibrillation with a history of AV node ablation in 2015, she remains in normal sinus rhythm, no anticoagulation due to history of GI bleed, hypertension, pacemaker (Biotronik), hypothyroidism, CML, pleural effusion last seen by general cardiology Hoy Glatter, FNP 11/11/2023 and by Dr. Hildegard Rinks 01/20/2024  On GDMT for HF including lisinopril, metoprolol; after upgraded biventricular pacemaker left ventricular function had improved to 55 to 60% but recent ECHO showed decreased to 25-30%; recurrent bilateral pleural effusions with thoracentesis; started on Eliquis 5 mg BID after developing LV thrombus   The patient is a 89 year old female who presents for evaluation of decreased heart pump strength.  She has been under the care of Dr. Silver, although she has not had a recent visit. Her last echocardiogram was conducted approximately a few months ago and noted decreased heart pump strength. She reports no noticeable changes in her breathing or daily activities. However, it is mentioned that she has been feeling slightly unwell recently. She recalls previous instances where fluid had to be drained from around her lungs but does not believe this is currently an issue. She acknowledges a need for increased physical activity and mentions a sensation of phlegm in her throat. She reports no swelling in her abdomen or legs and overall, feels well. It is noted that her glucose levels were elevated during a recent check by her son and daughter in law in office today.  Overall, patient's family  feels that she has slowed down a little but overall doing okay; she denies chest pain, shortness of breath, orthopnea, syncope, presyncope, heart racing, palpitations  SOCIAL HISTORY Exercise: The patient  reports not doing much moving and acknowledges the need to move a little more.    Assessment & Plan:  1. Persistent atrial fibrillation    (CMD) (Primary)   2. HFrEF (heart failure with reduced ejection fraction)   -ECHO updated with reduced EF   3. Nonrheumatic mitral valve regurgitation   4. Primary hypertension--well controlled today   5. Presence of cardiac pacemaker   6. S/P AV nodal ablation    Assessment & Plan 1. Decreased heart pump strength: - Heart pump strength has decreased from 55% to 35%. - No noticeable difference in shortness of breath or daily activities. - No evidence of fluid buildup around lungs, as there are no breathing difficulties. - Diminished breath sounds noted on the lower right side. - Not currently on any medications that would improve heart pump strength. - Order a chest x-ray today to rule out pleural effusions. - Communicate results to [place]. - If significant fluid accumulation is detected, a procedure to drain the fluid may be necessary. - If fluid accumulation is not severe, consider a short course of diuretic medication. - Monitor sodium and potassium levels closely.       Past Medical History: Medical History[1]  Past Surgical History: Surgical History[2]  Medications:  Current Medications[3]  Allergies: Allergies[4]  Social History: Tobacco Use History[5] Social History   Substance and Sexual Activity  Alcohol Use No   Social History   Substance and Sexual Activity  Drug Use No    Family History: Family History[6]  Review of Systems:  CONSTITUTIONAL:  As per HPI. HEENT: Normocephalic, atraumatic. No scleral icterus. Mucosa pink and moist. Neck supple. CARDIOVASCULAR:  No  pressure, squeezing, tightness, heaviness of the chest.  RESPIRATORY:  No cough, shortness of breath. No PND or orthopnea. GASTROINTESTINAL:  No nausea, vomiting or diarrhea. GENITOURINARY:  No dysuria, frequency or  urgency. MUSCULOSKELETAL:  As per HPI. SKIN:  No change in skin, hair or nails. NEUROLOGIC:  No numbness, dizziness, or weakness. PSYCHIATRIC:  No disorder of thought or mood. ENDOCRINE:  No heat or cold intolerance, polyuria or polydipsia. HEMATOLOGICAL:  No easy bruising or blood in the urine or stool.    VITAL SIGNS:  Vitals:   07/13/24 1419  BP: 126/76  Pulse: 88  Resp: 16  SpO2: 96%     Physical Exam: GENERAL: Appears well. NAD. A+O X 3  HEENT: Normocephalic, Anicteric.  NECK: supple, Trachea midline, Normal JVP, No bruits. No lymphadenopathy or thyromegaly.  RESPIRATORY: diminished breath sounds right posterior lower field otherwise normal.  No wheezing or Rhonchi heard COR: Normal precordium regular rhythm, Normal rate, Normal S1 and S2.  No murmur/rub, or gallops heard.  ABDOMEN: + BS, Soft, Non tender.  VASCULAR: Normal radial,  posterior tibial pulses bilaterally 1-2+ EXTREMITIES: No cyanosis or clubbing. No edema of lower extremities.  NEURO: normal mental status, no gross focal motor deficit.  SKIN: no rash noted, or bruising. MUSCLE -SKELETAL :  full range of motion PSYCH: normal behavior, appropriate. Test Results Results Imaging  - Echocardiogram: Heart pump strength has decreased from 55% to 35%. 01/2024 limited TTE   SUMMARY  The left ventricle is moderately dilated.  Left ventricular systolic function is severely reduced.  There is severe global hypokinesis of the left ventricle.  Abnormal  paradoxical  septal motion consistent with RV pacemaker.  LV ejection fraction = 25-30%.  Mural thrombus not noted  There is moderate mitral regurgitation.  Compared to the last study dated 01-06-2024 mural thrombus not noted     05/24/2024 Device download  BIO CRT-D remote transmission reviewed. Normal device function. CRT  pacing: 98%. No episodes. Lead trends, histograms, and other diagnostic  trends stable. Battery status: 80% remaining Next remote in 91  days.   Labs:  Sodium  Date/Time Value Ref Range Status  01/12/2024 01:24 PM 143 136 - 145 mmol/L Final   Potassium  Date/Time Value Ref Range Status  01/12/2024 01:24 PM 3.8 3.5 - 5.1 mmol/L Final    Comment:    NO VISIBLE HEMOLYSIS   Magnesium  Date/Time Value Ref Range Status  01/12/2024 01:24 PM 1.7 (L) 1.9 - 2.7 mg/dL Final   Blood Urea Nitrogen (BUN)  Date/Time Value Ref Range Status  01/12/2024 01:24 PM 14 7 - 25 mg/dL Final   Creatinine  Date/Time Value Ref Range Status  01/12/2024 01:24 PM 0.73 0.60 - 1.20 mg/dL Final   Hemoglobin  Date/Time Value Ref Range Status  01/12/2024 01:24 PM 13.9 12.3 - 15.3 g/dL Final   Hematocrit  Date/Time Value Ref Range Status  01/12/2024 01:24 PM 41.5 35.9 - 44.6 % Final   Platelet Count (PLT)  Date/Time Value Ref Range Status  01/12/2024 01:24 PM 210 150 - 450 10*3/uL Final   No results found for: INR, PROTIME  I have personally spent total of 30 minutes involved in face-to-face and non-face-to-face activities for this patient on the day of the visit. Professional time spent includes the following activities, in addition to those noted in the documentation: Reviewing labs, test result, counseling, care coordination, and EMR documentation  Electronically signed by:  Hoy Sebastian Glatter, NP, 07/13/2024 8:24 AM        [  1] Past Medical History: Diagnosis Date  . Acute on chronic diastolic CHF (congestive heart failure), NYHA class 3    (CMD) 08/08/2022  . Hypothyroidism 11/15/2017  . Leukemia    (CMD) 11/15/2017  . Primary hypertension 04/12/2021  . Wears dentures    upper partial  [2] Past Surgical History: Procedure Laterality Date  . CARDIAC PACEMAKER PLACEMENT  09/17/2016   Procedure: CARDIAC PACEMAKER PLACEMENT; Biotronik Dr [place]  . COLONOSCOPY     Procedure: COLONOSCOPY  . TAH-BSO (TOTAL ABDOMINAL HYSTERECTOMY W/ BILATERAL SALPINGOOPHORECTOMY     Procedure: TOTAL ABDOMINAL HYSTERECTOMY W/ BILATERAL  SALPINGOOPHORECTOMY  [3]  Current Outpatient Medications:  .  acetaminophen (TYLENOL) 500 mg tablet, Take 500 mg by mouth every 4 (four) hours as needed., Disp: 60 tablet, Rfl: 11 .  alum-mag hydroxide-simethicone (MAALOX, MYLANTA) 200-200-20 mg/5 mL susp suspension, Take 30 mL by mouth every 6 (six) hours as needed., Disp: , Rfl:  .  aspirin 81 mg EC tablet, Take 81 mg by mouth Once Daily., Disp: , Rfl:  .  calcium carbonate (TUMS) 500 mg (200 mg calcium) chewable tablet, Take 2 tablets by mouth every 6 (six) hours as needed for heartburn., Disp: , Rfl:  .  chlorzoxazone (LORZONE) 500 mg tablet, Take 500 mg by mouth 3 (three) times a day as needed (muscle spasms)., Disp: 60 tablet, Rfl: 11 .  donepeziL  (ARICEPT ) 10 mg tablet, Take 10 mg by mouth nightly., Disp: 90 tablet, Rfl: 1 .  DULoxetine (CYMBALTA) 60 mg capsule, Take 60 mg by mouth Once Daily., Disp: 90 capsule, Rfl: 3 .  Eliquis 5 mg tab, TAKE 1 TABLET BY MOUTH TWICE A DAY, Disp: 6 tablet, Rfl: 10 .  furosemide (LASIX) 40 mg tablet, Take 40 mg by mouth 2 (two) times a day., Disp: 180 tablet, Rfl: 3 .  guaiFENesin (ROBITUSSIN) 100 mg/5 mL syrup, Take 300 mg by mouth 4 (four) times a day as needed for cough., Disp: , Rfl:  .  levothyroxine (SYNTHROID) 75 mcg tablet, TAKE ONE TABLET BY MOUTH EVERY DAY AT SIX IN THE MORNING, Disp: 90 tablet, Rfl: 1 .  lisinopriL (PRINIVIL) 2.5 mg tablet, Take 2.5 mg by mouth Once Daily., Disp: 90 tablet, Rfl: 3 .  loperamide (IMODIUM A-D) 2 mg tablet, Take 4 mg by mouth 4 (four) times a day as needed for diarrhea., Disp: , Rfl:  .  LORazepam (ATIVAN) 0.5 mg tablet, Take 0.5 mg by mouth every 6 (six) hours as needed for anxiety., Disp: 30 tablet, Rfl: 0 .  magnesium hydroxide (MILK OF MAGNESIA) 400 mg/5 mL suspension, Take 30 mL by mouth nightly as needed., Disp: , Rfl:  .  memantine  (NAMENDA ) 10 mg tablet, Take 10 mg by mouth 2 (two) times a day., Disp: 180 tablet, Rfl: 3 .  metoprolol succinate (TOPROL XL)  50 mg 24 hr tablet, TAKE 1 TABLET BY MOUTH ONCE DAILY * DO NOT CRUSH * **NOTE DOSE AND STRENGTH** **NO REFILLS**, Disp: 30 tablet, Rfl: 10 .  mirtazapine (REMERON) 15 mg tablet, TAKE ONE TABLET BY MOUTH NIGHTLY, Disp: 90 tablet, Rfl: 3 .  neomycin-bacitracinZn-polymyxnB (Triple Antibiotic) ointment, Apply 1 Application topically daily as needed., Disp: , Rfl:  .  nutritional drink (Ensure) liqd liquid, Take 1 Can by mouth 4 (four) times a day as needed., Disp: , Rfl:  .  omeprazole (PriLOSEC) 40 mg DR capsule, Take 40 mg by mouth Once Daily., Disp: , Rfl:  .  ondansetron (ZOFRAN) 4 mg tablet, Take 4 mg by mouth  every 8 (eight) hours as needed for nausea., Disp: , Rfl:  .  phosphorated carbohydrate (Anti-Nausea) oral solution, Take 15 mL by mouth every 15 (fifteen) minutes as needed., Disp: , Rfl:  .  potassium chloride (KLOR-CON) 10 mEq ER tablet, Take 10 mEq by mouth Once Daily., Disp: 90 tablet, Rfl: 1 .  potassium chloride (KLOR-CON) 20 mEq ER tablet, Take 40 mEq by mouth Once Daily., Disp: , Rfl:  .  spironolactone (ALDACTONE) 25 mg tablet, Take 25 mg by mouth Once Daily for 30 days., Disp: 30 tablet, Rfl: 0 [4] Allergies Allergen Reactions  . Levofloxacin Other (See Comments)    unknown  [5] Social History Tobacco Use  Smoking Status Never  Smokeless Tobacco Never  [6] Family History Problem Relation Name Age of Onset  . Heart disease Mother    . Diabetes Mother    . Diabetes Father    . Cancer Sister         Biliary cancer

## 2024-07-14 NOTE — Progress Notes (Signed)
 Based on chest xray results showing moderate sized right sided pleural effusion, will have patient take her FUROSEMIDE 40 mg daily for next 5 days and return to have chest xray checked in one week 07/21/2024.  Will also need lab recheck upon her return to provider.

## 2024-07-15 NOTE — Telephone Encounter (Signed)
 Nat with [facility] calling states there fax machine was down yesterday and would like to request orders be re faxed.  Cb# [phone]   Fax# [phone]

## 2024-07-27 NOTE — Telephone Encounter (Signed)
 Attempted to reach [place] at facility at which patient lives. Unable to reach. Contacted patient son to discuss. Patient has order from Hoy Glatter, NP to have repeat CXR after increasing lasix for a few days due to pleural effusion (order under Media tab). He wants to know if she needs fluid drawn off if she has to go to ER or outpatient treatment. He is advised if patient is in distress she needs to go to ER for evaluation. He reports she is having back pain/burping which are same symptoms she had in past when she needed a thoracentesis. No other symptoms. Advised to have CXR completed and we can go from there. After talking with him, Nichole called from facility. She has arranged for patient to have CXR done and will have house doctor advise on result as Ms. Glatter is no longer with our practice. Nichole confirms same symptoms and no distress - We go through this every year with Ms. Mcclory. [place], RN

## 2024-07-27 NOTE — Telephone Encounter (Signed)
**  TRIAGE CALL**  Reason for Call:son calling about pt and the fluid she has  He is not wanting to take her to the ER  Pls call to advise  Patient Complaint/Problem:  Onset Date:  Cardiologist/Vascular Surgeon:Mitchell  Best Call Back [phone] [place]
